# Patient Record
Sex: Male | Born: 1935 | Race: Black or African American | Hispanic: No | Marital: Married | Smoking: Former smoker
Health system: Southern US, Community
[De-identification: ages and names within clinical notes are randomized; demographics above are authoritative.]

## PROBLEM LIST (undated history)

## (undated) DIAGNOSIS — R238 Other skin changes: Secondary | ICD-10-CM

## (undated) DIAGNOSIS — R5383 Other fatigue: Secondary | ICD-10-CM

## (undated) DIAGNOSIS — R233 Spontaneous ecchymoses: Secondary | ICD-10-CM

## (undated) DIAGNOSIS — M7989 Other specified soft tissue disorders: Secondary | ICD-10-CM

## (undated) DIAGNOSIS — R413 Other amnesia: Secondary | ICD-10-CM

## (undated) DIAGNOSIS — I10 Essential (primary) hypertension: Secondary | ICD-10-CM

## (undated) DIAGNOSIS — R252 Cramp and spasm: Secondary | ICD-10-CM

## (undated) DIAGNOSIS — Z87448 Personal history of other diseases of urinary system: Secondary | ICD-10-CM

## (undated) HISTORY — DX: Spontaneous ecchymoses: R23.3

## (undated) HISTORY — DX: Essential (primary) hypertension: I10

## (undated) HISTORY — DX: Other specified soft tissue disorders: M79.89

## (undated) HISTORY — DX: Cramp and spasm: R25.2

## (undated) HISTORY — DX: Other fatigue: R53.83

## (undated) HISTORY — DX: Personal history of other diseases of urinary system: Z87.448

## (undated) HISTORY — DX: Other skin changes: R23.8

## (undated) HISTORY — DX: Other amnesia: R41.3

---

## 2010-10-13 DIAGNOSIS — R5383 Other fatigue: Secondary | ICD-10-CM

## 2010-10-13 DIAGNOSIS — E119 Type 2 diabetes mellitus without complications: Secondary | ICD-10-CM | POA: Insufficient documentation

## 2010-10-13 DIAGNOSIS — R079 Chest pain, unspecified: Secondary | ICD-10-CM | POA: Insufficient documentation

## 2010-10-13 DIAGNOSIS — M791 Myalgia, unspecified site: Secondary | ICD-10-CM | POA: Insufficient documentation

## 2010-10-13 DIAGNOSIS — I10 Essential (primary) hypertension: Secondary | ICD-10-CM | POA: Insufficient documentation

## 2010-10-13 DIAGNOSIS — R413 Other amnesia: Secondary | ICD-10-CM | POA: Insufficient documentation

## 2010-10-13 DIAGNOSIS — M7989 Other specified soft tissue disorders: Secondary | ICD-10-CM | POA: Insufficient documentation

## 2010-10-13 DIAGNOSIS — R238 Other skin changes: Secondary | ICD-10-CM | POA: Insufficient documentation

## 2010-10-13 DIAGNOSIS — Z87448 Personal history of other diseases of urinary system: Secondary | ICD-10-CM | POA: Insufficient documentation

## 2011-05-01 DIAGNOSIS — D352 Benign neoplasm of pituitary gland: Secondary | ICD-10-CM | POA: Insufficient documentation

## 2011-05-18 DIAGNOSIS — G4730 Sleep apnea, unspecified: Secondary | ICD-10-CM | POA: Insufficient documentation

## 2011-05-18 DIAGNOSIS — I10 Essential (primary) hypertension: Secondary | ICD-10-CM | POA: Insufficient documentation

## 2011-05-18 DIAGNOSIS — H269 Unspecified cataract: Secondary | ICD-10-CM | POA: Insufficient documentation

## 2011-05-18 DIAGNOSIS — E119 Type 2 diabetes mellitus without complications: Secondary | ICD-10-CM | POA: Insufficient documentation

## 2011-12-19 DIAGNOSIS — E1169 Type 2 diabetes mellitus with other specified complication: Secondary | ICD-10-CM | POA: Insufficient documentation

## 2011-12-19 DIAGNOSIS — E782 Mixed hyperlipidemia: Secondary | ICD-10-CM | POA: Insufficient documentation

## 2011-12-19 DIAGNOSIS — E785 Hyperlipidemia, unspecified: Secondary | ICD-10-CM | POA: Insufficient documentation

## 2012-09-30 DIAGNOSIS — K5730 Diverticulosis of large intestine without perforation or abscess without bleeding: Secondary | ICD-10-CM | POA: Insufficient documentation

## 2012-09-30 DIAGNOSIS — E876 Hypokalemia: Secondary | ICD-10-CM | POA: Insufficient documentation

## 2012-09-30 DIAGNOSIS — I519 Heart disease, unspecified: Secondary | ICD-10-CM | POA: Insufficient documentation

## 2012-09-30 DIAGNOSIS — M25569 Pain in unspecified knee: Secondary | ICD-10-CM | POA: Insufficient documentation

## 2012-09-30 DIAGNOSIS — I130 Hypertensive heart and chronic kidney disease with heart failure and stage 1 through stage 4 chronic kidney disease, or unspecified chronic kidney disease: Secondary | ICD-10-CM | POA: Insufficient documentation

## 2012-09-30 DIAGNOSIS — K649 Unspecified hemorrhoids: Secondary | ICD-10-CM | POA: Insufficient documentation

## 2012-09-30 DIAGNOSIS — M47817 Spondylosis without myelopathy or radiculopathy, lumbosacral region: Secondary | ICD-10-CM | POA: Insufficient documentation

## 2012-09-30 DIAGNOSIS — M545 Low back pain, unspecified: Secondary | ICD-10-CM | POA: Insufficient documentation

## 2012-09-30 DIAGNOSIS — Z7982 Long term (current) use of aspirin: Secondary | ICD-10-CM | POA: Insufficient documentation

## 2012-09-30 DIAGNOSIS — I517 Cardiomegaly: Secondary | ICD-10-CM | POA: Insufficient documentation

## 2012-09-30 DIAGNOSIS — K449 Diaphragmatic hernia without obstruction or gangrene: Secondary | ICD-10-CM | POA: Insufficient documentation

## 2012-09-30 DIAGNOSIS — I10 Essential (primary) hypertension: Secondary | ICD-10-CM | POA: Insufficient documentation

## 2012-09-30 DIAGNOSIS — K219 Gastro-esophageal reflux disease without esophagitis: Secondary | ICD-10-CM | POA: Insufficient documentation

## 2012-09-30 DIAGNOSIS — E785 Hyperlipidemia, unspecified: Secondary | ICD-10-CM | POA: Insufficient documentation

## 2012-09-30 DIAGNOSIS — E119 Type 2 diabetes mellitus without complications: Secondary | ICD-10-CM | POA: Insufficient documentation

## 2012-09-30 DIAGNOSIS — J309 Allergic rhinitis, unspecified: Secondary | ICD-10-CM | POA: Insufficient documentation

## 2012-09-30 DIAGNOSIS — J449 Chronic obstructive pulmonary disease, unspecified: Secondary | ICD-10-CM | POA: Insufficient documentation

## 2013-01-30 ENCOUNTER — Ambulatory Visit: Payer: Self-pay

## 2013-05-29 ENCOUNTER — Ambulatory Visit (INDEPENDENT_AMBULATORY_CARE_PROVIDER_SITE_OTHER): Payer: Medicare Other

## 2013-05-29 VITALS — BP 133/66 | HR 57 | Resp 16

## 2013-05-29 DIAGNOSIS — E1149 Type 2 diabetes mellitus with other diabetic neurological complication: Secondary | ICD-10-CM

## 2013-05-29 DIAGNOSIS — L608 Other nail disorders: Secondary | ICD-10-CM

## 2013-05-29 DIAGNOSIS — E1142 Type 2 diabetes mellitus with diabetic polyneuropathy: Secondary | ICD-10-CM

## 2013-05-29 DIAGNOSIS — E1140 Type 2 diabetes mellitus with diabetic neuropathy, unspecified: Secondary | ICD-10-CM

## 2013-05-29 DIAGNOSIS — Q828 Other specified congenital malformations of skin: Secondary | ICD-10-CM

## 2013-05-29 NOTE — Progress Notes (Signed)
   Subjective:    Patient ID: Richard Weeks, male    DOB: [birth year], 80 y.o.   MRN: [id number]  HPI I need my toenails trimmed up and I need my calluses trimmed up also    Review of Systems no new changes or findings     Objective:   Physical Exam Vascular status is intact although diminished DP pulses +2 for PT one over 4 bilateral Refill time 3 seconds all digits. Epicritic and progressive sensations diminished on Semmes Weinstein testing to the digits and plantar forefoot and arch. There digital contractures associated keratoses. Nails thick it'll friable discolored and darkened and incurvated tender both on palpation and with enclosed shoe wear. Neurologically epicritic and proprioceptive sensations diminished as mentioned there no open wounds or ulcerations at this time.       Assessment & Plan:  Assessment diabetes with history peripheral neuropathy. Multiple dystrophic probably mycotic nails with onychomycosis and dystrophy debrided and the presence of diabetes and complications. Also debridement single keratoses inferior right heel area , return for future palliative care in an as-needed basis  Harriet Masson DPM

## 2013-05-29 NOTE — Patient Instructions (Signed)
Diabetes and Foot Care Diabetes may cause you to have problems because of poor blood supply (circulation) to your feet and legs. This may cause the skin on your feet to become thinner, break easier, and heal more slowly. Your skin may become dry, and the skin may peel and crack. You may also have nerve damage in your legs and feet causing decreased feeling in them. You may not notice minor injuries to your feet that could lead to infections or more serious problems. Taking care of your feet is one of the most important things you can do for yourself.  HOME CARE INSTRUCTIONS  Wear shoes at all times, even in the house. Do not go barefoot. Bare feet are easily injured.  Check your feet daily for blisters, cuts, and redness. If you cannot see the bottom of your feet, use a mirror or ask someone for help.  Wash your feet with warm water (do not use hot water) and mild soap. Then pat your feet and the areas between your toes until they are completely dry. Do not soak your feet as this can dry your skin.  Apply a moisturizing lotion or petroleum jelly (that does not contain alcohol and is unscented) to the skin on your feet and to dry, brittle toenails. Do not apply lotion between your toes.  Trim your toenails straight across. Do not dig under them or around the cuticle. File the edges of your nails with an emery board or nail file.  Do not cut corns or calluses or try to remove them with medicine.  Wear clean socks or stockings every day. Make sure they are not too tight. Do not wear knee-high stockings since they may decrease blood flow to your legs.  Wear shoes that fit properly and have enough cushioning. To break in new shoes, wear them for just a few hours a day. This prevents you from injuring your feet. Always look in your shoes before you put them on to be sure there are no objects inside.  Do not cross your legs. This may decrease the blood flow to your feet.  If you find a minor scrape,  cut, or break in the skin on your feet, keep it and the skin around it clean and dry. These areas may be cleansed with mild soap and water. Do not cleanse the area with peroxide, alcohol, or iodine.  When you remove an adhesive bandage, be sure not to damage the skin around it.  If you have a wound, look at it several times a day to make sure it is healing.  Do not use heating pads or hot water bottles. They may burn your skin. If you have lost feeling in your feet or legs, you may not know it is happening until it is too late.  Make sure your health care provider performs a complete foot exam at least annually or more often if you have foot problems. Report any cuts, sores, or bruises to your health care provider immediately. SEEK MEDICAL CARE IF:   You have an injury that is not healing.  You have cuts or breaks in the skin.  You have an ingrown nail.  You notice redness on your legs or feet.  You feel burning or tingling in your legs or feet.  You have pain or cramps in your legs and feet.  Your legs or feet are numb.  Your feet always feel cold. SEEK IMMEDIATE MEDICAL CARE IF:   There is increasing redness,   swelling, or pain in or around a wound.  There is a red line that goes up your leg.  Pus is coming from a wound.  You develop a fever or as directed by your health care provider.  You notice a bad smell coming from an ulcer or wound. Document Released: 04/07/2000 Document Revised: 12/11/2012 Document Reviewed: 09/17/2012 [employer] Patient Information 2014 [employer].  

## 2013-08-28 ENCOUNTER — Ambulatory Visit: Payer: Medicare Other

## 2013-09-18 ENCOUNTER — Ambulatory Visit (INDEPENDENT_AMBULATORY_CARE_PROVIDER_SITE_OTHER): Payer: Medicare Other

## 2013-09-18 VITALS — BP 148/73 | HR 56 | Resp 18

## 2013-09-18 DIAGNOSIS — L608 Other nail disorders: Secondary | ICD-10-CM

## 2013-09-18 DIAGNOSIS — E1140 Type 2 diabetes mellitus with diabetic neuropathy, unspecified: Secondary | ICD-10-CM

## 2013-09-18 DIAGNOSIS — E1149 Type 2 diabetes mellitus with other diabetic neurological complication: Secondary | ICD-10-CM

## 2013-09-18 DIAGNOSIS — Q828 Other specified congenital malformations of skin: Secondary | ICD-10-CM

## 2013-09-18 NOTE — Progress Notes (Signed)
   Subjective:    Patient ID: Richard Weeks, male    DOB: 02/01/1933, 80 y.o.   MRN: [id number]  HPI I am here to get my toenails trimmed up    Review of Systems no new findings or systemic changes noted    Objective:   Physical Exam Masker status is intact pedal pulses palpable DP and PT plus one over 4 bilateral capillary refill time 3 seconds all digits epicritic and proprioceptive sensations diminished on Semmes Weinstein testing to forefoot digits and plantar foot and arch bilateral. There is normal plantar response DTRs not elicited dermatologically skin color pigment normal hair growth absent nails thick brittle crumbly friable discolored and darkened consistent with onychomycosis and dystrophy of nails. There is also keratoses distal clavus third left and pinch callus of the hallux bilateral although not as severe as they have in the past the benefit from use of diabetic shoes and palliative care and regular debridement. 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 Also this time debridement of dystrophic friable gratified nails 1 through 5 bilateral painful tender symptomatic nails debrided by Neosporin applied to the fifth toe left following debridement patient reappointed 3 months for continued palliative nail care we'll still try to  obtain authorization for diabetic accident shoes for his primary physician patient will take paperwork himself directly to get a copy that filled out. Reappointed 3 months for continued palliative care  Harriet Masson DPM

## 2013-09-18 NOTE — Patient Instructions (Signed)
Diabetes and Foot Care Diabetes may cause you to have problems because of poor blood supply (circulation) to your feet and legs. This may cause the skin on your feet to become thinner, break easier, and heal more slowly. Your skin may become dry, and the skin may peel and crack. You may also have nerve damage in your legs and feet causing decreased feeling in them. You may not notice minor injuries to your feet that could lead to infections or more serious problems. Taking care of your feet is one of the most important things you can do for yourself.  HOME CARE INSTRUCTIONS  Wear shoes at all times, even in the house. Do not go barefoot. Bare feet are easily injured.  Check your feet daily for blisters, cuts, and redness. If you cannot see the bottom of your feet, use a mirror or ask someone for help.  Wash your feet with warm water (do not use hot water) and mild soap. Then pat your feet and the areas between your toes until they are completely dry. Do not soak your feet as this can dry your skin.  Apply a moisturizing lotion or petroleum jelly (that does not contain alcohol and is unscented) to the skin on your feet and to dry, brittle toenails. Do not apply lotion between your toes.  Trim your toenails straight across. Do not dig under them or around the cuticle. File the edges of your nails with an emery board or nail file.  Do not cut corns or calluses or try to remove them with medicine.  Wear clean socks or stockings every day. Make sure they are not too tight. Do not wear knee-high stockings since they may decrease blood flow to your legs.  Wear shoes that fit properly and have enough cushioning. To break in new shoes, wear them for just a few hours a day. This prevents you from injuring your feet. Always look in your shoes before you put them on to be sure there are no objects inside.  Do not cross your legs. This may decrease the blood flow to your feet.  If you find a minor scrape,  cut, or break in the skin on your feet, keep it and the skin around it clean and dry. These areas may be cleansed with mild soap and water. Do not cleanse the area with peroxide, alcohol, or iodine.  When you remove an adhesive bandage, be sure not to damage the skin around it.  If you have a wound, look at it several times a day to make sure it is healing.  Do not use heating pads or hot water bottles. They may burn your skin. If you have lost feeling in your feet or legs, you may not know it is happening until it is too late.  Make sure your health care provider performs a complete foot exam at least annually or more often if you have foot problems. Report any cuts, sores, or bruises to your health care provider immediately. SEEK MEDICAL CARE IF:   You have an injury that is not healing.  You have cuts or breaks in the skin.  You have an ingrown nail.  You notice redness on your legs or feet.  You feel burning or tingling in your legs or feet.  You have pain or cramps in your legs and feet.  Your legs or feet are numb.  Your feet always feel cold. SEEK IMMEDIATE MEDICAL CARE IF:   There is increasing redness,   swelling, or pain in or around a wound.  There is a red line that goes up your leg.  Pus is coming from a wound.  You develop a fever or as directed by your health care provider.  You notice a bad smell coming from an ulcer or wound. Document Released: 04/07/2000 Document Revised: 12/11/2012 Document Reviewed: 09/17/2012 [employer] Patient Information 2014 [employer].  

## 2013-12-18 ENCOUNTER — Ambulatory Visit (INDEPENDENT_AMBULATORY_CARE_PROVIDER_SITE_OTHER): Payer: Medicare Other

## 2013-12-18 VITALS — BP 155/81 | HR 59 | Resp 18

## 2013-12-18 DIAGNOSIS — E1149 Type 2 diabetes mellitus with other diabetic neurological complication: Secondary | ICD-10-CM

## 2013-12-18 DIAGNOSIS — E1140 Type 2 diabetes mellitus with diabetic neuropathy, unspecified: Secondary | ICD-10-CM

## 2013-12-18 DIAGNOSIS — Q828 Other specified congenital malformations of skin: Secondary | ICD-10-CM

## 2013-12-18 DIAGNOSIS — L608 Other nail disorders: Secondary | ICD-10-CM

## 2013-12-18 NOTE — Patient Instructions (Signed)
Diabetes and Foot Care Diabetes may cause you to have problems because of poor blood supply (circulation) to your feet and legs. This may cause the skin on your feet to become thinner, break easier, and heal more slowly. Your skin may become dry, and the skin may peel and crack. You may also have nerve damage in your legs and feet causing decreased feeling in them. You may not notice minor injuries to your feet that could lead to infections or more serious problems. Taking care of your feet is one of the most important things you can do for yourself.  HOME CARE INSTRUCTIONS  Wear shoes at all times, even in the house. Do not go barefoot. Bare feet are easily injured.  Check your feet daily for blisters, cuts, and redness. If you cannot see the bottom of your feet, use a mirror or ask someone for help.  Wash your feet with warm water (do not use hot water) and mild soap. Then pat your feet and the areas between your toes until they are completely dry. Do not soak your feet as this can dry your skin.  Apply a moisturizing lotion or petroleum jelly (that does not contain alcohol and is unscented) to the skin on your feet and to dry, brittle toenails. Do not apply lotion between your toes.  Trim your toenails straight across. Do not dig under them or around the cuticle. File the edges of your nails with an emery board or nail file.  Do not cut corns or calluses or try to remove them with medicine.  Wear clean socks or stockings every day. Make sure they are not too tight. Do not wear knee-high stockings since they may decrease blood flow to your legs.  Wear shoes that fit properly and have enough cushioning. To break in new shoes, wear them for just a few hours a day. This prevents you from injuring your feet. Always look in your shoes before you put them on to be sure there are no objects inside.  Do not cross your legs. This may decrease the blood flow to your feet.  If you find a minor scrape,  cut, or break in the skin on your feet, keep it and the skin around it clean and dry. These areas may be cleansed with mild soap and water. Do not cleanse the area with peroxide, alcohol, or iodine.  When you remove an adhesive bandage, be sure not to damage the skin around it.  If you have a wound, look at it several times a day to make sure it is healing.  Do not use heating pads or hot water bottles. They may burn your skin. If you have lost feeling in your feet or legs, you may not know it is happening until it is too late.  Make sure your health care provider performs a complete foot exam at least annually or more often if you have foot problems. Report any cuts, sores, or bruises to your health care provider immediately. SEEK MEDICAL CARE IF:   You have an injury that is not healing.  You have cuts or breaks in the skin.  You have an ingrown nail.  You notice redness on your legs or feet.  You feel burning or tingling in your legs or feet.  You have pain or cramps in your legs and feet.  Your legs or feet are numb.  Your feet always feel cold. SEEK IMMEDIATE MEDICAL CARE IF:   There is increasing redness,   swelling, or pain in or around a wound.  There is a red line that goes up your leg.  Pus is coming from a wound.  You develop a fever or as directed by your health care provider.  You notice a bad smell coming from an ulcer or wound. Document Released: 04/07/2000 Document Revised: 12/11/2012 Document Reviewed: 09/17/2012 [employer] Patient Information 2015 [employer]. This information is not intended to replace advice given to you by your health care provider. Make sure you discuss any questions you have with your health care provider.  

## 2013-12-18 NOTE — Progress Notes (Signed)
   Subjective:    Patient ID: Richard Weeks, male    DOB: 1933-03-08, 80 y.o.   MRN: [id number]  HPI I AM HERE TO GET MY TOENAILS TRIMMED AND TO CHECK MY FEET    Review of Systems no new findings or systemic changes noted     Objective:   Physical Exam Pedal pulses palpable DP and PT plus one over 4 bilateral capillary refill timed 3-4 seconds. Epicritic sensations diminished on Semmes Weinstein to forefoot digits. 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

## 2014-03-16 ENCOUNTER — Ambulatory Visit (INDEPENDENT_AMBULATORY_CARE_PROVIDER_SITE_OTHER): Payer: Medicare Other

## 2014-03-16 DIAGNOSIS — L603 Nail dystrophy: Secondary | ICD-10-CM

## 2014-03-16 DIAGNOSIS — E1140 Type 2 diabetes mellitus with diabetic neuropathy, unspecified: Secondary | ICD-10-CM

## 2014-03-16 DIAGNOSIS — Q828 Other specified congenital malformations of skin: Secondary | ICD-10-CM

## 2014-03-16 NOTE — Progress Notes (Signed)
   Subjective:    Patient ID: Richard Weeks, male    DOB: Apr 02, 1933, 80 y.o.   MRN: [id number]  HPI  TOENAILS TRIM.  Review of Systems no new findings or systemic changes noted     Objective:   Physical Exam Lower extremity objective findings DP and PT plus one over 4 capillary refill time 3 seconds epicritic sensation decreased on Semmes Weinstein. Nails thick extremely brittle crumbly friable dystrophic 1 through 5 bilateral no open wounds no ulcers no secondary infections still waiting on diabetic shoes gravida been ordered but not available for fitting. Patient does have diabetes with neuropathy and angiopathy. 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

## 2014-03-16 NOTE — Patient Instructions (Signed)
Diabetes and Foot Care Diabetes may cause you to have problems because of poor blood supply (circulation) to your feet and legs. This may cause the skin on your feet to become thinner, break easier, and heal more slowly. Your skin may become dry, and the skin may peel and crack. You may also have nerve damage in your legs and feet causing decreased feeling in them. You may not notice minor injuries to your feet that could lead to infections or more serious problems. Taking care of your feet is one of the most important things you can do for yourself.  HOME CARE INSTRUCTIONS  Wear shoes at all times, even in the house. Do not go barefoot. Bare feet are easily injured.  Check your feet daily for blisters, cuts, and redness. If you cannot see the bottom of your feet, use a mirror or ask someone for help.  Wash your feet with warm water (do not use hot water) and mild soap. Then pat your feet and the areas between your toes until they are completely dry. Do not soak your feet as this can dry your skin.  Apply a moisturizing lotion or petroleum jelly (that does not contain alcohol and is unscented) to the skin on your feet and to dry, brittle toenails. Do not apply lotion between your toes.  Trim your toenails straight across. Do not dig under them or around the cuticle. File the edges of your nails with an emery board or nail file.  Do not cut corns or calluses or try to remove them with medicine.  Wear clean socks or stockings every day. Make sure they are not too tight. Do not wear knee-high stockings since they may decrease blood flow to your legs.  Wear shoes that fit properly and have enough cushioning. To break in new shoes, wear them for just a few hours a day. This prevents you from injuring your feet. Always look in your shoes before you put them on to be sure there are no objects inside.  Do not cross your legs. This may decrease the blood flow to your feet.  If you find a minor scrape,  cut, or break in the skin on your feet, keep it and the skin around it clean and dry. These areas may be cleansed with mild soap and water. Do not cleanse the area with peroxide, alcohol, or iodine.  When you remove an adhesive bandage, be sure not to damage the skin around it.  If you have a wound, look at it several times a day to make sure it is healing.  Do not use heating pads or hot water bottles. They may burn your skin. If you have lost feeling in your feet or legs, you may not know it is happening until it is too late.  Make sure your health care provider performs a complete foot exam at least annually or more often if you have foot problems. Report any cuts, sores, or bruises to your health care provider immediately. SEEK MEDICAL CARE IF:   You have an injury that is not healing.  You have cuts or breaks in the skin.  You have an ingrown nail.  You notice redness on your legs or feet.  You feel burning or tingling in your legs or feet.  You have pain or cramps in your legs and feet.  Your legs or feet are numb.  Your feet always feel cold. SEEK IMMEDIATE MEDICAL CARE IF:   There is increasing redness,   swelling, or pain in or around a wound.  There is a red line that goes up your leg.  Pus is coming from a wound.  You develop a fever or as directed by your health care provider.  You notice a bad smell coming from an ulcer or wound. Document Released: 04/07/2000 Document Revised: 12/11/2012 Document Reviewed: 09/17/2012 [employer] Patient Information 2015 [employer]. This information is not intended to replace advice given to you by your health care provider. Make sure you discuss any questions you have with your health care provider.  

## 2014-05-15 ENCOUNTER — Ambulatory Visit: Payer: Medicare Other

## 2014-06-01 ENCOUNTER — Ambulatory Visit (INDEPENDENT_AMBULATORY_CARE_PROVIDER_SITE_OTHER): Payer: Medicare Other

## 2014-06-01 VITALS — BP 157/78 | HR 64 | Resp 18

## 2014-06-01 DIAGNOSIS — M2040 Other hammer toe(s) (acquired), unspecified foot: Secondary | ICD-10-CM

## 2014-06-01 DIAGNOSIS — E1140 Type 2 diabetes mellitus with diabetic neuropathy, unspecified: Secondary | ICD-10-CM

## 2014-06-01 DIAGNOSIS — Q828 Other specified congenital malformations of skin: Secondary | ICD-10-CM

## 2014-06-01 DIAGNOSIS — L603 Nail dystrophy: Secondary | ICD-10-CM

## 2014-06-01 NOTE — Progress Notes (Signed)
   Subjective:    Patient ID: Richard Weeks, male    DOB: 01/31/1933, 81 y.o.   MRN: [id number]  HPI I AM HERE TO GET MY DIABETIC SHOES AND I NEED MY TOENAILS TRIMMED UP    Review of Systems no new findings or systemic changes noted     Objective:   Physical Exam Neurovascular status is intact and unchanged pedal pulses DP and PT plus one over 4 bilateral Refill time 3 seconds all digits epicritic and proprioceptive sensations grossly diminished on Semmes Weinstein to the forefoot digits arch bilateral. No open wounds no ulcers patient does have keratoses subsecond right pinch callus first bilateral and distal clavus third left. Multiple keratotic lesions are noted at this time and debrided as well as multiple thick brittle friable criptotic nails 1 through 5 bilateral painful mycotic dystrophic incurvated ingrowing nails are debrided and the presence of pain symptoms mild as well as diabetes and complications. Patient does have digital contractures dry scaling skin recommended lotion daily. Patient at this time is dispensed 1 pair of diabetic shoes and 3 pairs of custom molded dual density inlays at fit and contour well with full contact to the foot and arch bilateral. Oral and written instructions for shoewear and break in are given at this time. Nails are also debrided and calluses or calluses are debrided       Assessment & Plan:  Assessment diabetes with peripheral neuropathy and complications and angiopathy. Debridement of dystrophic friable mycotic nails 1 through 5 bilateral this time debridement multiple keratoses pinch callus bilateral hallux subsecond right and distal third left. Also this time dispense shoes with an dual density insoles with break in wearing instructions recheck in 3 months for future palliative care is needed no other new complications or changes noted at this time next  Harriet Masson DPM

## 2014-06-18 ENCOUNTER — Ambulatory Visit: Payer: Medicare Other

## 2014-09-03 ENCOUNTER — Ambulatory Visit: Payer: Medicare Other | Admitting: Podiatrist

## 2014-09-17 ENCOUNTER — Encounter: Payer: Self-pay | Admitting: Podiatrist

## 2014-09-17 ENCOUNTER — Ambulatory Visit (INDEPENDENT_AMBULATORY_CARE_PROVIDER_SITE_OTHER): Payer: Medicare Other | Admitting: Podiatrist

## 2014-09-17 VITALS — BP 125/65 | HR 61 | Resp 18

## 2014-09-17 DIAGNOSIS — L603 Nail dystrophy: Secondary | ICD-10-CM

## 2014-09-17 DIAGNOSIS — E1140 Type 2 diabetes mellitus with diabetic neuropathy, unspecified: Secondary | ICD-10-CM

## 2014-09-17 DIAGNOSIS — Q828 Other specified congenital malformations of skin: Secondary | ICD-10-CM | POA: Diagnosis not present

## 2014-09-22 NOTE — Progress Notes (Signed)
HPI: Patient presents today for follow up of diabetic foot and nail care. Past medical history, meds, and allergies reviewed. Patient states blood sugar is under good  control.   Objective:   Objective:  Patients chart is reviewed.  Vascular status reveals pedal pulses noted at  1 out of 4 dp and pt bilateral .  Neurological sensation is Decreased to [employer] monofilament bilateral at 2/5 sites bilateral.  Dermatological exam reveals  presence of pre ulcerative/ hyperkeratotic lesions noted bilateral feet.   Toenails are elongated, incurvated, discolored, dystrophic with ingrown deformity present.    Assessment: Diabetes with Neuropathy , Ingrown nail deformity, hyperkeratotic lesion bilateral feet.   Plan: Discussed treatment options and alternatives. Debrided nails without complication. Debrided hyperkeratotic lesions without complication.  Return appointment recommended at routine intervals of 3 months.

## 2014-11-03 DIAGNOSIS — G4733 Obstructive sleep apnea (adult) (pediatric): Secondary | ICD-10-CM | POA: Insufficient documentation

## 2014-12-21 ENCOUNTER — Ambulatory Visit (INDEPENDENT_AMBULATORY_CARE_PROVIDER_SITE_OTHER): Payer: Medicare Other | Admitting: Podiatry

## 2014-12-21 ENCOUNTER — Encounter: Payer: Self-pay | Admitting: Podiatry

## 2014-12-21 DIAGNOSIS — E1140 Type 2 diabetes mellitus with diabetic neuropathy, unspecified: Secondary | ICD-10-CM

## 2014-12-21 DIAGNOSIS — B351 Tinea unguium: Secondary | ICD-10-CM

## 2014-12-21 DIAGNOSIS — M79676 Pain in unspecified toe(s): Secondary | ICD-10-CM | POA: Diagnosis not present

## 2014-12-21 NOTE — Progress Notes (Signed)
Patient ID: Richard Weeks, male   DOB: [birth year], 81 y.o.   MRN: [id number] Complaint:  Visit Type: Patient returns to my office for continued preventative foot care services. Complaint: Patient states" my nails have grown long and thick and become painful to walk and wear shoes" Patient has been diagnosed with DM with neuropathy.. The patient presents for preventative foot care services. No changes to ROS  Podiatric Exam: Vascular: dorsalis pedis and posterior tibial pulses are palpable bilateral. Capillary return is immediate. Temperature gradient is WNL. Skin turgor WNL  Sensorium: Diminished Semmes Weinstein monofilament test. Normal tactile sensation bilaterally. Nail Exam: Pt has thick disfigured discolored nails with subungual debris noted bilateral entire nail hallux through fifth toenails Ulcer Exam: There is no evidence of ulcer or pre-ulcerative changes or infection. Orthopedic Exam: Muscle tone and strength are WNL. No limitations in general ROM. No crepitus or effusions noted. Foot type and digits show no abnormalities. Bony prominences are unremarkable. Skin: No Porokeratosis. No infection or ulcers  Diagnosis:  Onychomycosis, , Pain in right toe, pain in left toes  Treatment & Plan Procedures and Treatment: Consent by patient was obtained for treatment procedures. The patient understood the discussion of treatment and procedures well. All questions were answered thoroughly reviewed. Debridement of mycotic and hypertrophic toenails, 1 through 5 bilateral and clearing of subungual debris. No ulceration, no infection noted.  Return Visit-Office Procedure: Patient instructed to return to the office for a follow up visit 3 months for continued evaluation and treatment.

## 2015-03-03 ENCOUNTER — Ambulatory Visit: Payer: Medicare Other | Admitting: Podiatry

## 2015-03-03 ENCOUNTER — Ambulatory Visit: Payer: Medicare Other | Admitting: Sports Medicine

## 2015-03-04 ENCOUNTER — Encounter: Payer: Self-pay | Admitting: Sports Medicine

## 2015-03-04 ENCOUNTER — Ambulatory Visit (INDEPENDENT_AMBULATORY_CARE_PROVIDER_SITE_OTHER): Payer: Medicare Other | Admitting: Sports Medicine

## 2015-03-04 DIAGNOSIS — E1140 Type 2 diabetes mellitus with diabetic neuropathy, unspecified: Secondary | ICD-10-CM

## 2015-03-04 DIAGNOSIS — B351 Tinea unguium: Secondary | ICD-10-CM | POA: Diagnosis not present

## 2015-03-04 DIAGNOSIS — M79676 Pain in unspecified toe(s): Secondary | ICD-10-CM | POA: Diagnosis not present

## 2015-03-04 NOTE — Progress Notes (Signed)
Patient ID: Richard Weeks, male   DOB: 1932/10/11, 81 y.o.   MRN: [id number] Subjective: Richard Weeks is a 81 y.o. male patient with history of type 2 diabetes who presents to office today complaining of long, painful nails  while ambulating in shoes; unable to trim. Patient states that the glucose reading this morning was not recorded. Patient denies any new changes in medication or new problems. Patient denies any new cramping, numbness, burning or tingling in the legs.  Patient Active Problem List   Diagnosis Date Noted  . Diabetes mellitus 10/13/2010  . Hypertension 10/13/2010  . Muscle pain 10/13/2010  . Bilateral swelling of feet 10/13/2010  . Fatigue 10/13/2010  . Chest pain 10/13/2010  . Memory loss 10/13/2010  . Bruises easily 10/13/2010  . History of bladder problems 10/13/2010   Current Outpatient Prescriptions on File Prior to Visit  Medication Sig Dispense Refill  . albuterol (PROVENTIL HFA;VENTOLIN HFA) 108 (90 BASE) MCG/ACT inhaler Inhale into the lungs.    Marland Kitchen aspirin 325 MG tablet Take 325 mg by mouth.    . Aspirin-Caffeine 500-32.5 MG TABS Take by mouth.    Marland Kitchen atenolol (TENORMIN) 50 MG tablet Take 50 mg by mouth.    . ATENOLOL PO Take by mouth daily.      Marland Kitchen atorvastatin (LIPITOR) 10 MG tablet Take 10 mg by mouth.    . baclofen (LIORESAL) 10 MG tablet Take 10 mg by mouth 3 (three) times daily as needed. for muscle spams  0  . baclofen (LIORESAL) 10 MG tablet Take 10 mg by mouth.    . budesonide (PULMICORT) 0.25 MG/2ML nebulizer solution 0.25 mg.    . budesonide (RHINOCORT AQUA) 32 MCG/ACT nasal spray Frequency:PHARMDIR   Dosage:0.0     Instructions:  Note:2 Sprays / nostril QHS. Dose: 2 SPRAYS    . budesonide (RHINOCORT AQUA) 32 MCG/ACT nasal spray Spray 2 squirts each nostril at bedtime    . budesonide (RHINOCORT AQUA) 32 MCG/ACT nasal spray Spray 2 squirts each nostril at bedtime    . capsaicin (ZOSTRIX) 0.025 % cream Frequency:PHARMDIR   Dosage:0.0     Instructions:   Note:Apply to knees up toTID as needed for pain.`E1o3L` Wash hands after use. Dose: 0.025 %    . CLONIDINE HCL PO Take by mouth 2 (two) times daily.      Marland Kitchen diltiazem (CARDIZEM SR) 120 MG 12 hr capsule Take 120 mg by mouth 2 (two) times daily.  1  . diltiazem (DILACOR XR) 120 MG 24 hr capsule Take 120 mg by mouth.    Marland Kitchen DILTIAZEM HCL PO Take by mouth 2 (two) times daily.      . Dutasteride (AVODART PO) Take by mouth.      . dutasteride (AVODART) 0.5 MG capsule Take 0.5 mg by mouth daily.      . finasteride (PROSCAR) 5 MG tablet Take 5 mg by mouth.    . finasteride (PROSCAR) 5 MG tablet Take 5 mg by mouth daily.  25  . Fluticasone-Salmeterol (ADVAIR DISKUS) 250-50 MCG/DOSE AEPB Inhale into the lungs.    . Fluticasone-Salmeterol (ADVAIR HFA IN) Inhale into the lungs.      Marland Kitchen glimepiride (AMARYL) 1 MG tablet Take 1 mg by mouth.    Marland Kitchen GLIMEPIRIDE PO Take by mouth daily.      . hydrALAZINE (APRESOLINE) 50 MG tablet Take 50 mg by mouth.    . hydrALAZINE (APRESOLINE) 50 MG tablet Increased to 3 pills a day    . HYDRALAZINE HCL PO  Take 50 mg by mouth 2 (two) times daily.      Marland Kitchen HYDROcodone-acetaminophen (VICODIN) 5-500 MG per tablet Take by mouth.    Marland Kitchen HYDROcodone-homatropine (HYCODAN) 5-1.5 MG/5ML syrup   0  . IPRATROPIUM BROMIDE NA Place into the nose every 4 (four) hours.      . Ipratropium-Albuterol (ALBUTEROL-IPRATROPIUM IN) Inhale into the lungs every 4 (four) hours.      Marland Kitchen labetalol (NORMODYNE) 200 MG tablet Take 200 mg by mouth.    Marland Kitchen lisinopril (PRINIVIL,ZESTRIL) 10 MG tablet Take 10 mg by mouth daily.  2  . lisinopril (PRINIVIL,ZESTRIL) 10 MG tablet Take 10 mg by mouth.    . loratadine (CLARITIN) 10 MG tablet Take 10 mg by mouth.    . loratadine (CLARITIN) 10 MG tablet Take 10 mg by mouth.    Marland Kitchen LORazepam (ATIVAN) 0.5 MG tablet   2  . meclizine (ANTIVERT) 12.5 MG tablet Take 12.5 mg by mouth.    . meloxicam (MOBIC) 7.5 MG tablet Take 7.5 mg by mouth daily. with food  3  . METFORMIN HCL PO  Take by mouth daily.      . mometasone (ASMANEX 120 METERED DOSES) 220 MCG/INH inhaler Frequency:PHARMDIR   Dosage:0.0     Instructions:  Note:Inhale 1 inhalation twice daily. Rinse mouth after using`E1o3L` 3 month supply Dose: ONE    . mometasone (ASMANEX) 220 MCG/INH inhaler Inhale 2 puffs into the lungs daily.      . mometasone (ASMANEX) 220 MCG/INH inhaler Inhale into the lungs.    . mometasone-formoterol (DULERA) 100-5 MCG/ACT AERO Inhale into the lungs.    . montelukast (SINGULAIR) 10 MG tablet Take 10 mg by mouth.    . Montelukast Sodium (SINGULAIR PO) Take by mouth daily.      . nitroGLYCERIN (NITROSTAT) 0.4 MG SL tablet Place 0.4 mg under the tongue.    . Omega-3 Fatty Acids (OMEGA 3 PO) Take 2 capsules by mouth daily.      Marland Kitchen omeprazole (PRILOSEC) 20 MG capsule TAKE 1 CAPSULE DAILY    . Potassium Chlorate GRAN by Does not apply route 2 (two) times daily.      . potassium chloride SA (K-DUR,KLOR-CON) 20 MEQ tablet Take by mouth.    . potassium chloride SA (KLOR-CON M20) 20 MEQ tablet Take by mouth.    . Ranitidine HCl 150 MG PACK Take 150 mg by mouth 2 (two) times daily.      . solifenacin (VESICARE) 5 MG tablet Take 10 mg by mouth.    . solifenacin (VESICARE) 5 MG tablet Take 5 mg by mouth.    . tamsulosin (FLOMAX) 0.4 MG CAPS capsule Take 0.4 mg by mouth.    . TAMSULOSIN HCL PO Take by mouth daily.      Marland Kitchen terazosin (HYTRIN) 2 MG capsule Take 2 mg by mouth.    . TERAZOSIN HCL PO Take 2 mg by mouth daily.      Marland Kitchen tiotropium (SPIRIVA HANDIHALER) 18 MCG inhalation capsule Place into inhaler and inhale.    . tiotropium (SPIRIVA) 18 MCG inhalation capsule Place into inhaler and inhale.    . tiotropium (SPIRIVA) 18 MCG inhalation capsule Place into inhaler and inhale.    . TRIAMTERENE PO Take by mouth daily.      Marland Kitchen triamterene-hydrochlorothiazide (MAXZIDE-25) 37.5-25 MG per tablet Take by mouth.     No current facility-administered medications on file prior to visit.   No Known  Allergies  Labs: HEMOGLOBIN A1C- No recent lab on file  Objective: General: Patient is awake, alert, and oriented x 3 and in no acute distress.  Integument: Skin is warm, dry and supple bilateral. Nails are tender, long, thickened and  dystrophic with subungual debris, consistent with onychomycosis, 1-5 bilateral. No signs of infection. No open lesions or preulcerative lesions present bilateral. Remaining integument unremarkable.  Vasculature:  Dorsalis Pedis pulse 1/4 bilateral. Posterior Tibial pulse  1/4 bilateral.  Capillary fill time <3 sec 1-5 bilateral. Scant hair growth to the level of the digits. Temperature gradient within normal limits. No varicosities present bilateral. No edema present bilateral.   Neurology: The patient has diminished sensation measured with a 5.07/10g Semmes Weinstein Monofilament at all pedal sites bilateral . Vibratory sensation diminished bilateral with tuning fork. No Babinski sign present bilateral.   Musculoskeletal: Asymptomatic hammertoes deformities noted bilateral. Muscular strength 5/5 in all lower extremity muscular groups bilateral without pain or limitation on range of motion . No tenderness with calf compression bilateral.  Assessment and Plan: Problem List Items Addressed This Visit    None    Visit Diagnoses    Pain due to onychomycosis of toenail    -  Primary    Type 2 diabetes, controlled, with neuropathy ([place])          -Examined patient. -Discussed and educated patient on diabetic foot care, especially with  regards to the vascular, neurological and musculoskeletal systems.  -Stressed the importance of good glycemic control and the detriment of not  controlling glucose levels in relation to the foot. -Mechanically debrided all nails 1-5 bilateral using sterile nail nipper and filed with dremel without incident  -Cont with good supportive shoes daily.  -Answered all patient questions -Patient to return as needed or in 3 months  for at risk foot care -Patient advised to call the office if any problems or questions arise in the  Meantime.  Landis Martins, DPM

## 2015-06-03 ENCOUNTER — Encounter: Payer: Self-pay | Admitting: Sports Medicine

## 2015-06-03 ENCOUNTER — Ambulatory Visit (INDEPENDENT_AMBULATORY_CARE_PROVIDER_SITE_OTHER): Payer: Medicare Other | Admitting: Sports Medicine

## 2015-06-03 DIAGNOSIS — B351 Tinea unguium: Secondary | ICD-10-CM

## 2015-06-03 DIAGNOSIS — E1140 Type 2 diabetes mellitus with diabetic neuropathy, unspecified: Secondary | ICD-10-CM

## 2015-06-03 DIAGNOSIS — M79676 Pain in unspecified toe(s): Secondary | ICD-10-CM | POA: Diagnosis not present

## 2015-06-03 NOTE — Progress Notes (Signed)
Patient ID: Dredan Timbers, male   DOB: Jun 29, 1932, 82 y.o.   MRN: [id number] Subjective: Richard Weeks is a 82 y.o. male patient with history of type 2 diabetes who presents to office today complaining of long, painful nails  while ambulating in shoes; unable to trim. Patient states that the glucose reading this morning was 110mg /dl. Patient denies any new changes in medication or new problems. Patient denies any new cramping, numbness, burning or tingling in the legs.  Patient is to have TKR on right on Tuesday in [place].   Patient Active Problem List   Diagnosis Date Noted  . Diabetes mellitus 10/13/2010  . Hypertension 10/13/2010  . Muscle pain 10/13/2010  . Bilateral swelling of feet 10/13/2010  . Fatigue 10/13/2010  . Chest pain 10/13/2010  . Memory loss 10/13/2010  . Bruises easily 10/13/2010  . History of bladder problems 10/13/2010   Current Outpatient Prescriptions on File Prior to Visit  Medication Sig Dispense Refill  . albuterol (PROVENTIL HFA;VENTOLIN HFA) 108 (90 BASE) MCG/ACT inhaler Inhale into the lungs.    Marland Kitchen aspirin 325 MG tablet Take 325 mg by mouth.    . Aspirin-Caffeine 500-32.5 MG TABS Take by mouth.    Marland Kitchen atenolol (TENORMIN) 50 MG tablet Take 50 mg by mouth.    . ATENOLOL PO Take by mouth daily.      Marland Kitchen atorvastatin (LIPITOR) 10 MG tablet Take 10 mg by mouth.    . baclofen (LIORESAL) 10 MG tablet Take 10 mg by mouth 3 (three) times daily as needed. for muscle spams  0  . baclofen (LIORESAL) 10 MG tablet Take 10 mg by mouth.    . budesonide (PULMICORT) 0.25 MG/2ML nebulizer solution 0.25 mg.    . budesonide (RHINOCORT AQUA) 32 MCG/ACT nasal spray Frequency:PHARMDIR   Dosage:0.0     Instructions:  Note:2 Sprays / nostril QHS. Dose: 2 SPRAYS    . budesonide (RHINOCORT AQUA) 32 MCG/ACT nasal spray Spray 2 squirts each nostril at bedtime    . budesonide (RHINOCORT AQUA) 32 MCG/ACT nasal spray Spray 2 squirts each nostril at bedtime    . capsaicin (ZOSTRIX) 0.025 % cream  Frequency:PHARMDIR   Dosage:0.0     Instructions:  Note:Apply to knees up toTID as needed for pain.`E1o3L` Wash hands after use. Dose: 0.025 %    . CLONIDINE HCL PO Take by mouth 2 (two) times daily.      Marland Kitchen diltiazem (CARDIZEM SR) 120 MG 12 hr capsule Take 120 mg by mouth 2 (two) times daily.  1  . diltiazem (DILACOR XR) 120 MG 24 hr capsule Take 120 mg by mouth.    Marland Kitchen DILTIAZEM HCL PO Take by mouth 2 (two) times daily.      . Dutasteride (AVODART PO) Take by mouth.      . dutasteride (AVODART) 0.5 MG capsule Take 0.5 mg by mouth daily.      . finasteride (PROSCAR) 5 MG tablet Take 5 mg by mouth.    . finasteride (PROSCAR) 5 MG tablet Take 5 mg by mouth daily.  25  . Fluticasone-Salmeterol (ADVAIR DISKUS) 250-50 MCG/DOSE AEPB Inhale into the lungs.    . Fluticasone-Salmeterol (ADVAIR HFA IN) Inhale into the lungs.      Marland Kitchen glimepiride (AMARYL) 1 MG tablet Take 1 mg by mouth.    Marland Kitchen GLIMEPIRIDE PO Take by mouth daily.      . hydrALAZINE (APRESOLINE) 50 MG tablet Take 50 mg by mouth.    . hydrALAZINE (APRESOLINE) 50 MG tablet Increased  to 3 pills a day    . HYDRALAZINE HCL PO Take 50 mg by mouth 2 (two) times daily.      Marland Kitchen HYDROcodone-acetaminophen (VICODIN) 5-500 MG per tablet Take by mouth.    Marland Kitchen HYDROcodone-homatropine (HYCODAN) 5-1.5 MG/5ML syrup   0  . IPRATROPIUM BROMIDE NA Place into the nose every 4 (four) hours.      . Ipratropium-Albuterol (ALBUTEROL-IPRATROPIUM IN) Inhale into the lungs every 4 (four) hours.      Marland Kitchen labetalol (NORMODYNE) 200 MG tablet Take 200 mg by mouth.    Marland Kitchen lisinopril (PRINIVIL,ZESTRIL) 10 MG tablet Take 10 mg by mouth daily.  2  . lisinopril (PRINIVIL,ZESTRIL) 10 MG tablet Take 10 mg by mouth.    . loratadine (CLARITIN) 10 MG tablet Take 10 mg by mouth.    . loratadine (CLARITIN) 10 MG tablet Take 10 mg by mouth.    Marland Kitchen LORazepam (ATIVAN) 0.5 MG tablet   2  . meclizine (ANTIVERT) 12.5 MG tablet Take 12.5 mg by mouth.    . meloxicam (MOBIC) 7.5 MG tablet Take 7.5 mg by  mouth daily. with food  3  . METFORMIN HCL PO Take by mouth daily.      . mometasone (ASMANEX 120 METERED DOSES) 220 MCG/INH inhaler Frequency:PHARMDIR   Dosage:0.0     Instructions:  Note:Inhale 1 inhalation twice daily. Rinse mouth after using`E1o3L` 3 month supply Dose: ONE    . mometasone (ASMANEX) 220 MCG/INH inhaler Inhale 2 puffs into the lungs daily.      . mometasone (ASMANEX) 220 MCG/INH inhaler Inhale into the lungs.    . mometasone-formoterol (DULERA) 100-5 MCG/ACT AERO Inhale into the lungs.    . montelukast (SINGULAIR) 10 MG tablet Take 10 mg by mouth.    . Montelukast Sodium (SINGULAIR PO) Take by mouth daily.      . nitroGLYCERIN (NITROSTAT) 0.4 MG SL tablet Place 0.4 mg under the tongue.    . Omega-3 Fatty Acids (OMEGA 3 PO) Take 2 capsules by mouth daily.      Marland Kitchen omeprazole (PRILOSEC) 20 MG capsule TAKE 1 CAPSULE DAILY    . Potassium Chlorate GRAN by Does not apply route 2 (two) times daily.      . potassium chloride SA (K-DUR,KLOR-CON) 20 MEQ tablet Take by mouth.    . potassium chloride SA (KLOR-CON M20) 20 MEQ tablet Take by mouth.    . Ranitidine HCl 150 MG PACK Take 150 mg by mouth 2 (two) times daily.      . solifenacin (VESICARE) 5 MG tablet Take 10 mg by mouth.    . solifenacin (VESICARE) 5 MG tablet Take 5 mg by mouth.    . tamsulosin (FLOMAX) 0.4 MG CAPS capsule Take 0.4 mg by mouth.    . TAMSULOSIN HCL PO Take by mouth daily.      Marland Kitchen terazosin (HYTRIN) 2 MG capsule Take 2 mg by mouth.    . TERAZOSIN HCL PO Take 2 mg by mouth daily.      Marland Kitchen tiotropium (SPIRIVA HANDIHALER) 18 MCG inhalation capsule Place into inhaler and inhale.    . tiotropium (SPIRIVA) 18 MCG inhalation capsule Place into inhaler and inhale.    . tiotropium (SPIRIVA) 18 MCG inhalation capsule Place into inhaler and inhale.    . TRIAMTERENE PO Take by mouth daily.      Marland Kitchen triamterene-hydrochlorothiazide (MAXZIDE-25) 37.5-25 MG per tablet Take by mouth.     No current facility-administered  medications on file prior to visit.  No Known Allergies  Labs: HEMOGLOBIN A1C- No recent lab on file   Objective: General: Patient is awake, alert, and oriented x 3 and in no acute distress.  Integument: Skin is warm, dry and supple bilateral. Nails are tender, long, thickened and  dystrophic with subungual debris, consistent with onychomycosis, 1-5 bilateral. No signs of infection. No open lesions or preulcerative lesions present bilateral. Remaining integument unremarkable.  Vasculature:  Dorsalis Pedis pulse 1/4 bilateral. Posterior Tibial pulse  1/4 bilateral.  Capillary fill time <3 sec 1-5 bilateral. Scant hair growth to the level of the digits. Temperature gradient within normal limits. No varicosities present bilateral. No edema present bilateral.   Neurology: The patient has diminished sensation measured with a 5.07/10g Semmes Weinstein Monofilament at all pedal sites bilateral . Vibratory sensation diminished bilateral with tuning fork. No Babinski sign present bilateral.   Musculoskeletal: Asymptomatic hammertoes deformities noted bilateral. Muscular strength 5/5 in all lower extremity muscular groups bilateral without pain or limitation on range of motion . No tenderness with calf compression bilateral.  Assessment and Plan: Problem List Items Addressed This Visit    None    Visit Diagnoses    Pain due to onychomycosis of toenail    -  Primary    Type 2 diabetes, controlled, with neuropathy ([place])          -Examined patient. -Discussed and educated patient on diabetic foot care, especially with  regards to the vascular, neurological and musculoskeletal systems.  -Stressed the importance of good glycemic control and the detriment of not  controlling glucose levels in relation to the foot. -Mechanically debrided all nails 1-5 bilateral using sterile nail nipper and filed with dremel without incident  -Recommend daily skin emollients.  -Cont with good supportive shoes  daily.  -Answered all patient questions -Patient to return as needed or in 3 months for at risk foot care -Patient advised to call the office if any problems or questions arise in the  Meantime.  Landis Martins, DPM

## 2015-06-09 ENCOUNTER — Ambulatory Visit: Payer: Medicare Other | Admitting: Sports Medicine

## 2015-09-01 ENCOUNTER — Ambulatory Visit (INDEPENDENT_AMBULATORY_CARE_PROVIDER_SITE_OTHER): Payer: Medicare Other | Admitting: Sports Medicine

## 2015-09-01 ENCOUNTER — Encounter: Payer: Self-pay | Admitting: Sports Medicine

## 2015-09-01 DIAGNOSIS — B351 Tinea unguium: Secondary | ICD-10-CM | POA: Diagnosis not present

## 2015-09-01 DIAGNOSIS — M79676 Pain in unspecified toe(s): Secondary | ICD-10-CM

## 2015-09-01 DIAGNOSIS — Q828 Other specified congenital malformations of skin: Secondary | ICD-10-CM | POA: Diagnosis not present

## 2015-09-01 DIAGNOSIS — E1140 Type 2 diabetes mellitus with diabetic neuropathy, unspecified: Secondary | ICD-10-CM | POA: Diagnosis not present

## 2015-09-01 DIAGNOSIS — M2040 Other hammer toe(s) (acquired), unspecified foot: Secondary | ICD-10-CM

## 2015-09-01 NOTE — Progress Notes (Signed)
Patient ID: Richard Weeks, male   DOB: 01-Dec-1932, 82 y.o.   MRN: [id number]   Subjective: Richard Weeks is a 82 y.o. male patient with history of type 2 diabetes who presents to office today complaining of long, painful nails  while ambulating in shoes and callus; unable to trim. Patient states that the glucose reading this morning was 97mg /dl. Patient denies any new changes in medication or new problems. Patient denies any new cramping, numbness, burning or tingling in the legs.  Patient had Right TKR in [place] and has finished therapy with no problems.   Patient Active Problem List   Diagnosis Date Noted  . Obstructive apnea 11/03/2014  . Allergic rhinitis 09/30/2012  . Cardiac enlargement 09/30/2012  . Chronic obstructive pulmonary disease ([place]) 09/30/2012  . Diaphragmatic hernia 09/30/2012  . Diverticular disease of large intestine 09/30/2012  . Long term current use of aspirin 09/30/2012  . Acid reflux 09/30/2012  . Essential (primary) hypertension 09/30/2012  . Cardiac disease 09/30/2012  . Hemorrhoid 09/30/2012  . HLD (hyperlipidemia) 09/30/2012  . Heart & renal disease, hypertensive, with heart failure ([place]) 09/30/2012  . Decreased potassium in the blood 09/30/2012  . LBP (low back pain) 09/30/2012  . Lumbosacral spondylosis 09/30/2012  . Arthralgia of lower leg 09/30/2012  . Type 2 diabetes mellitus ([place]) 09/30/2012  . Combined fat and carbohydrate induced hyperlipemia 12/19/2011  . Benign hypertension 05/18/2011  . Bilateral cataracts 05/18/2011  . Diabetes mellitus ([place]) 05/18/2011  . Apnea, sleep 05/18/2011  . Pituitary adenoma ([place]) 05/01/2011  . Diabetes mellitus 10/13/2010  . Hypertension 10/13/2010  . Muscle pain 10/13/2010  . Bilateral swelling of feet 10/13/2010  . Fatigue 10/13/2010  . Chest pain 10/13/2010  . Memory loss 10/13/2010  . Bruises easily 10/13/2010  . History of bladder problems 10/13/2010   Current Outpatient Prescriptions on File Prior to  Visit  Medication Sig Dispense Refill  . albuterol (PROVENTIL HFA;VENTOLIN HFA) 108 (90 BASE) MCG/ACT inhaler Inhale into the lungs.    Marland Kitchen aspirin 325 MG tablet Take 325 mg by mouth.    . Aspirin-Caffeine 500-32.5 MG TABS Take by mouth.    Marland Kitchen atenolol (TENORMIN) 50 MG tablet Take 50 mg by mouth.    . ATENOLOL PO Take by mouth daily.      Marland Kitchen atorvastatin (LIPITOR) 10 MG tablet Take 10 mg by mouth.    . baclofen (LIORESAL) 10 MG tablet Take 10 mg by mouth 3 (three) times daily as needed. for muscle spams  0  . baclofen (LIORESAL) 10 MG tablet Take 10 mg by mouth.    . budesonide (PULMICORT) 0.25 MG/2ML nebulizer solution 0.25 mg.    . budesonide (RHINOCORT AQUA) 32 MCG/ACT nasal spray Frequency:PHARMDIR   Dosage:0.0     Instructions:  Note:2 Sprays / nostril QHS. Dose: 2 SPRAYS    . budesonide (RHINOCORT AQUA) 32 MCG/ACT nasal spray Spray 2 squirts each nostril at bedtime    . budesonide (RHINOCORT AQUA) 32 MCG/ACT nasal spray Spray 2 squirts each nostril at bedtime    . capsaicin (ZOSTRIX) 0.025 % cream Frequency:PHARMDIR   Dosage:0.0     Instructions:  Note:Apply to knees up toTID as needed for pain.`E1o3L` Wash hands after use. Dose: 0.025 %    . CLONIDINE HCL PO Take by mouth 2 (two) times daily.      Marland Kitchen diltiazem (CARDIZEM SR) 120 MG 12 hr capsule Take 120 mg by mouth 2 (two) times daily.  1  . diltiazem (DILACOR XR) 120 MG 24  hr capsule Take 120 mg by mouth.    Marland Kitchen DILTIAZEM HCL PO Take by mouth 2 (two) times daily.      . Dutasteride (AVODART PO) Take by mouth.      . dutasteride (AVODART) 0.5 MG capsule Take 0.5 mg by mouth daily.      . finasteride (PROSCAR) 5 MG tablet Take 5 mg by mouth.    . finasteride (PROSCAR) 5 MG tablet Take 5 mg by mouth daily.  25  . Fluticasone-Salmeterol (ADVAIR DISKUS) 250-50 MCG/DOSE AEPB Inhale into the lungs.    . Fluticasone-Salmeterol (ADVAIR HFA IN) Inhale into the lungs.      Marland Kitchen glimepiride (AMARYL) 1 MG tablet Take 1 mg by mouth.    Marland Kitchen GLIMEPIRIDE PO  Take by mouth daily.      . hydrALAZINE (APRESOLINE) 50 MG tablet Take 50 mg by mouth.    . hydrALAZINE (APRESOLINE) 50 MG tablet Increased to 3 pills a day    . HYDRALAZINE HCL PO Take 50 mg by mouth 2 (two) times daily.      Marland Kitchen HYDROcodone-acetaminophen (VICODIN) 5-500 MG per tablet Take by mouth.    Marland Kitchen HYDROcodone-homatropine (HYCODAN) 5-1.5 MG/5ML syrup   0  . IPRATROPIUM BROMIDE NA Place into the nose every 4 (four) hours.      . Ipratropium-Albuterol (ALBUTEROL-IPRATROPIUM IN) Inhale into the lungs every 4 (four) hours.      Marland Kitchen labetalol (NORMODYNE) 200 MG tablet Take 200 mg by mouth.    Marland Kitchen lisinopril (PRINIVIL,ZESTRIL) 10 MG tablet Take 10 mg by mouth daily.  2  . lisinopril (PRINIVIL,ZESTRIL) 10 MG tablet Take 10 mg by mouth.    . loratadine (CLARITIN) 10 MG tablet Take 10 mg by mouth.    . loratadine (CLARITIN) 10 MG tablet Take 10 mg by mouth.    Marland Kitchen LORazepam (ATIVAN) 0.5 MG tablet   2  . meclizine (ANTIVERT) 12.5 MG tablet Take 12.5 mg by mouth.    . meloxicam (MOBIC) 7.5 MG tablet Take 7.5 mg by mouth daily. with food  3  . METFORMIN HCL PO Take by mouth daily.      . mometasone (ASMANEX 120 METERED DOSES) 220 MCG/INH inhaler Frequency:PHARMDIR   Dosage:0.0     Instructions:  Note:Inhale 1 inhalation twice daily. Rinse mouth after using`E1o3L` 3 month supply Dose: ONE    . mometasone (ASMANEX) 220 MCG/INH inhaler Inhale 2 puffs into the lungs daily.      . mometasone (ASMANEX) 220 MCG/INH inhaler Inhale into the lungs.    . mometasone-formoterol (DULERA) 100-5 MCG/ACT AERO Inhale into the lungs.    . montelukast (SINGULAIR) 10 MG tablet Take 10 mg by mouth.    . Montelukast Sodium (SINGULAIR PO) Take by mouth daily.      . nitroGLYCERIN (NITROSTAT) 0.4 MG SL tablet Place 0.4 mg under the tongue.    . Omega-3 Fatty Acids (OMEGA 3 PO) Take 2 capsules by mouth daily.      Marland Kitchen omeprazole (PRILOSEC) 20 MG capsule TAKE 1 CAPSULE DAILY    . Potassium Chlorate GRAN by Does not apply route 2  (two) times daily.      . potassium chloride SA (K-DUR,KLOR-CON) 20 MEQ tablet Take by mouth.    . potassium chloride SA (KLOR-CON M20) 20 MEQ tablet Take by mouth.    . Ranitidine HCl 150 MG PACK Take 150 mg by mouth 2 (two) times daily.      . solifenacin (VESICARE) 5 MG tablet Take 10 mg  by mouth.    . solifenacin (VESICARE) 5 MG tablet Take 5 mg by mouth.    . tamsulosin (FLOMAX) 0.4 MG CAPS capsule Take 0.4 mg by mouth.    . TAMSULOSIN HCL PO Take by mouth daily.      Marland Kitchen terazosin (HYTRIN) 2 MG capsule Take 2 mg by mouth.    . TERAZOSIN HCL PO Take 2 mg by mouth daily.      Marland Kitchen tiotropium (SPIRIVA HANDIHALER) 18 MCG inhalation capsule Place into inhaler and inhale.    . tiotropium (SPIRIVA) 18 MCG inhalation capsule Place into inhaler and inhale.    . tiotropium (SPIRIVA) 18 MCG inhalation capsule Place into inhaler and inhale.    . TRIAMTERENE PO Take by mouth daily.      Marland Kitchen triamterene-hydrochlorothiazide (MAXZIDE-25) 37.5-25 MG per tablet Take by mouth.     No current facility-administered medications on file prior to visit.   No Known Allergies  Labs: HEMOGLOBIN A1C- No recent lab on file   Objective: General: Patient is awake, alert, and oriented x 3 and in no acute distress.  Integument: Skin is warm, dry and supple bilateral. Nails are tender, long, thickened and  dystrophic with subungual debris, consistent with onychomycosis, 1-5 bilateral. No signs of infection. No open lesions or preulcerative lesions present bilateral. Mild reactive keratosis at left 2nd toe distal tuft. Remaining integument unremarkable.  Vasculature:  Dorsalis Pedis pulse 1/4 bilateral. Posterior Tibial pulse  1/4 bilateral.  Capillary fill time <3 sec 1-5 bilateral. Scant hair growth to the level of the digits. Temperature gradient within normal limits. No varicosities present bilateral. No edema present bilateral.   Neurology: The patient has diminished sensation measured with a 5.07/10g Semmes  Weinstein Monofilament at all pedal sites bilateral . Vibratory sensation diminished bilateral with tuning fork. No Babinski sign present bilateral.   Musculoskeletal: Asymptomatic hammertoes deformities noted bilateral. Muscular strength 5/5 in all lower extremity muscular groups bilateral without pain or limitation on range of motion . No tenderness with calf compression bilateral.  Assessment and Plan: Problem List Items Addressed This Visit    None    Visit Diagnoses    Pain due to onychomycosis of toenail    -  Primary    Type 2 diabetes, controlled, with neuropathy (HCC)        Porokeratosis        Hammertoe, unspecified laterality          -Examined patient. -Discussed and educated patient on diabetic foot care, especially with  regards to the vascular, neurological and musculoskeletal systems.  -Stressed the importance of good glycemic control and the detriment of not  controlling glucose levels in relation to the foot. -Mechanically debrided keratosis at tip of left 2nd toe using sterile chisel blade and all nails 1-5 bilateral using sterile nail nipper and filed with dremel without incident  -Recommend daily skin emollients.  -Cont with good supportive shoes daily.  -Answered all patient questions -Patient to return as needed or in 3 months for at risk foot care -Patient advised to call the office if any problems or questions arise in the meantime.  Landis Martins, DPM

## 2015-12-01 ENCOUNTER — Ambulatory Visit (INDEPENDENT_AMBULATORY_CARE_PROVIDER_SITE_OTHER): Payer: Medicare Other | Admitting: Sports Medicine

## 2015-12-01 ENCOUNTER — Encounter: Payer: Self-pay | Admitting: Sports Medicine

## 2015-12-01 DIAGNOSIS — M79676 Pain in unspecified toe(s): Secondary | ICD-10-CM

## 2015-12-01 DIAGNOSIS — B351 Tinea unguium: Secondary | ICD-10-CM

## 2015-12-01 DIAGNOSIS — E1140 Type 2 diabetes mellitus with diabetic neuropathy, unspecified: Secondary | ICD-10-CM

## 2015-12-01 DIAGNOSIS — M2040 Other hammer toe(s) (acquired), unspecified foot: Secondary | ICD-10-CM

## 2015-12-01 DIAGNOSIS — Q828 Other specified congenital malformations of skin: Secondary | ICD-10-CM

## 2015-12-01 NOTE — Progress Notes (Signed)
Patient ID: Richard Weeks, male   DOB: Oct 07, 1932, 82 y.o.   MRN: [id number]   Subjective: Richard Weeks is a 82 y.o. male patient with history of type 2 diabetes who presents to office today complaining of long, painful nails  while ambulating in shoes and callus; unable to trim. Patient states that the glucose reading this morning was "good". Patient denies any new changes in medication or new problems. Patient denies any new cramping, numbness, burning or tingling in the legs.  Patient had Right TKR and is consider getting left done.   Patient Active Problem List   Diagnosis Date Noted  . Obstructive apnea 11/03/2014  . Allergic rhinitis 09/30/2012  . Cardiac enlargement 09/30/2012  . Chronic obstructive pulmonary disease ([place]) 09/30/2012  . Diaphragmatic hernia 09/30/2012  . Diverticular disease of large intestine 09/30/2012  . Long term current use of aspirin 09/30/2012  . Acid reflux 09/30/2012  . Essential (primary) hypertension 09/30/2012  . Cardiac disease 09/30/2012  . Hemorrhoid 09/30/2012  . HLD (hyperlipidemia) 09/30/2012  . 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 Decreased potassium in the blood 09/30/2012  . LBP (low back pain) 09/30/2012  . Lumbosacral spondylosis 09/30/2012  . Arthralgia of lower leg 09/30/2012  . Type 2 diabetes mellitus ([place]) 09/30/2012  . Combined fat and carbohydrate induced hyperlipemia 12/19/2011  . Benign hypertension 05/18/2011  . Bilateral cataracts 05/18/2011  . Diabetes mellitus ([place]) 05/18/2011  . Apnea, sleep 05/18/2011  . Pituitary adenoma ([place]) 05/01/2011  . Diabetes mellitus 10/13/2010  . Hypertension 10/13/2010  . Muscle pain 10/13/2010  . Bilateral swelling of feet 10/13/2010  . Fatigue 10/13/2010  . Chest pain 10/13/2010  . Memory loss 10/13/2010  . Bruises easily 10/13/2010  . History of bladder problems 10/13/2010   Current Outpatient Prescriptions on File Prior to Visit  Medication Sig  Dispense Refill  . albuterol (PROVENTIL HFA;VENTOLIN HFA) 108 (90 BASE) MCG/ACT inhaler Inhale into the lungs.    Marland Kitchen aspirin 325 MG tablet Take 325 mg by mouth.    . Aspirin-Caffeine 500-32.5 MG TABS Take by mouth.    Marland Kitchen atenolol (TENORMIN) 50 MG tablet Take 50 mg by mouth.    . ATENOLOL PO Take by mouth daily.      Marland Kitchen atorvastatin (LIPITOR) 10 MG tablet Take 10 mg by mouth.    . baclofen (LIORESAL) 10 MG tablet Take 10 mg by mouth 3 (three) times daily as needed. for muscle spams  0  . baclofen (LIORESAL) 10 MG tablet Take 10 mg by mouth.    . budesonide (PULMICORT) 0.25 MG/2ML nebulizer solution 0.25 mg.    . budesonide (RHINOCORT AQUA) 32 MCG/ACT nasal spray Frequency:PHARMDIR   Dosage:0.0     Instructions:  Note:2 Sprays / nostril QHS. Dose: 2 SPRAYS    . budesonide (RHINOCORT AQUA) 32 MCG/ACT nasal spray Spray 2 squirts each nostril at bedtime    . budesonide (RHINOCORT AQUA) 32 MCG/ACT nasal spray Spray 2 squirts each nostril at bedtime    . capsaicin (ZOSTRIX) 0.025 % cream Frequency:PHARMDIR   Dosage:0.0     Instructions:  Note:Apply to knees up toTID as needed for pain.`E1o3L` Wash hands after use. Dose: 0.025 %    . CLONIDINE HCL PO Take by mouth 2 (two) times daily.      Marland Kitchen diltiazem (CARDIZEM SR) 120 MG 12 hr capsule Take 120 mg by mouth 2 (two) times daily.  1  . diltiazem (DILACOR XR) 120 MG 24 hr capsule Take  120 mg by mouth.    Marland Kitchen DILTIAZEM HCL PO Take by mouth 2 (two) times daily.      . Dutasteride (AVODART PO) Take by mouth.      . dutasteride (AVODART) 0.5 MG capsule Take 0.5 mg by mouth daily.      . finasteride (PROSCAR) 5 MG tablet Take 5 mg by mouth.    . finasteride (PROSCAR) 5 MG tablet Take 5 mg by mouth daily.  25  . Fluticasone-Salmeterol (ADVAIR DISKUS) 250-50 MCG/DOSE AEPB Inhale into the lungs.    . Fluticasone-Salmeterol (ADVAIR HFA IN) Inhale into the lungs.      Marland Kitchen glimepiride (AMARYL) 1 MG tablet Take 1 mg by mouth.    Marland Kitchen GLIMEPIRIDE PO Take by mouth daily.       . hydrALAZINE (APRESOLINE) 50 MG tablet Take 50 mg by mouth.    . hydrALAZINE (APRESOLINE) 50 MG tablet Increased to 3 pills a day    . HYDRALAZINE HCL PO Take 50 mg by mouth 2 (two) times daily.      Marland Kitchen HYDROcodone-acetaminophen (VICODIN) 5-500 MG per tablet Take by mouth.    Marland Kitchen HYDROcodone-homatropine (HYCODAN) 5-1.5 MG/5ML syrup   0  . IPRATROPIUM BROMIDE NA Place into the nose every 4 (four) hours.      . Ipratropium-Albuterol (ALBUTEROL-IPRATROPIUM IN) Inhale into the lungs every 4 (four) hours.      Marland Kitchen labetalol (NORMODYNE) 200 MG tablet Take 200 mg by mouth.    Marland Kitchen lisinopril (PRINIVIL,ZESTRIL) 10 MG tablet Take 10 mg by mouth daily.  2  . lisinopril (PRINIVIL,ZESTRIL) 10 MG tablet Take 10 mg by mouth.    . loratadine (CLARITIN) 10 MG tablet Take 10 mg by mouth.    . loratadine (CLARITIN) 10 MG tablet Take 10 mg by mouth.    Marland Kitchen LORazepam (ATIVAN) 0.5 MG tablet   2  . meclizine (ANTIVERT) 12.5 MG tablet Take 12.5 mg by mouth.    . meloxicam (MOBIC) 7.5 MG tablet Take 7.5 mg by mouth daily. with food  3  . METFORMIN HCL PO Take by mouth daily.      . mometasone (ASMANEX 120 METERED DOSES) 220 MCG/INH inhaler Frequency:PHARMDIR   Dosage:0.0     Instructions:  Note:Inhale 1 inhalation twice daily. Rinse mouth after using`E1o3L` 3 month supply Dose: ONE    . mometasone (ASMANEX) 220 MCG/INH inhaler Inhale 2 puffs into the lungs daily.      . mometasone (ASMANEX) 220 MCG/INH inhaler Inhale into the lungs.    . mometasone-formoterol (DULERA) 100-5 MCG/ACT AERO Inhale into the lungs.    . montelukast (SINGULAIR) 10 MG tablet Take 10 mg by mouth.    . Montelukast Sodium (SINGULAIR PO) Take by mouth daily.      . nitroGLYCERIN (NITROSTAT) 0.4 MG SL tablet Place 0.4 mg under the tongue.    . Omega-3 Fatty Acids (OMEGA 3 PO) Take 2 capsules by mouth daily.      Marland Kitchen omeprazole (PRILOSEC) 20 MG capsule TAKE 1 CAPSULE DAILY    . Potassium Chlorate GRAN by Does not apply route 2 (two) times daily.      .  potassium chloride SA (K-DUR,KLOR-CON) 20 MEQ tablet Take by mouth.    . potassium chloride SA (KLOR-CON M20) 20 MEQ tablet Take by mouth.    . Ranitidine HCl 150 MG PACK Take 150 mg by mouth 2 (two) times daily.      . solifenacin (VESICARE) 5 MG tablet Take 10 mg by mouth.    Marland Kitchen  solifenacin (VESICARE) 5 MG tablet Take 5 mg by mouth.    . tamsulosin (FLOMAX) 0.4 MG CAPS capsule Take 0.4 mg by mouth.    . TAMSULOSIN HCL PO Take by mouth daily.      Marland Kitchen terazosin (HYTRIN) 2 MG capsule Take 2 mg by mouth.    . TERAZOSIN HCL PO Take 2 mg by mouth daily.      Marland Kitchen tiotropium (SPIRIVA HANDIHALER) 18 MCG inhalation capsule Place into inhaler and inhale.    . tiotropium (SPIRIVA) 18 MCG inhalation capsule Place into inhaler and inhale.    . tiotropium (SPIRIVA) 18 MCG inhalation capsule Place into inhaler and inhale.    . TRIAMTERENE PO Take by mouth daily.      Marland Kitchen triamterene-hydrochlorothiazide (MAXZIDE-25) 37.5-25 MG per tablet Take by mouth.     No current facility-administered medications on file prior to visit.    No Known Allergies  Objective: General: Patient is awake, alert, and oriented x 3 and in no acute distress.  Integument: Skin is warm, dry and supple bilateral. Nails are tender, long, thickened and  dystrophic with subungual debris, consistent with onychomycosis, 1-5 bilateral. No signs of infection. No open lesions or preulcerative lesions present bilateral. Mild reactive keratosis at left 3rdtoe distal tuft. Remaining integument unremarkable.  Vasculature:  Dorsalis Pedis pulse 1/4 bilateral. Posterior Tibial pulse  1/4 bilateral.  Capillary fill time <3 sec 1-5 bilateral. Scant hair growth to the level of the digits. Temperature gradient within normal limits. No varicosities present bilateral. No edema present bilateral.   Neurology: The patient has diminished sensation measured with a 5.07/10g Semmes Weinstein Monofilament at all pedal sites bilateral . Vibratory sensation  diminished bilateral with tuning fork. No Babinski sign present bilateral.   Musculoskeletal: Asymptomatic hammertoes deformities noted bilateral. Muscular strength 5/5 in all lower extremity muscular groups bilateral without pain or limitation on range of motion . No tenderness with calf compression bilateral.  Assessment and Plan: Problem List Items Addressed This Visit    None    Visit Diagnoses    Pain due to onychomycosis of toenail    -  Primary   Type 2 diabetes, controlled, with neuropathy (HCC)       Porokeratosis       Hammertoe, unspecified laterality         -Examined patient. -Discussed and educated patient on diabetic foot care, especially with  regards to the vascular, neurological and musculoskeletal systems.  -Stressed the importance of good glycemic control and the detriment of not  controlling glucose levels in relation to the foot. -Mechanically debrided keratosis at tip of left 3rd toe using sterile chisel blade and all nails 1-5 bilateral using sterile nail nipper and filed with dremel without incident  -Recommend daily skin emollients.  -Cont with good supportive shoes daily.  -Answered all patient questions -Patient to return as needed or in 3 months for at risk foot care -Patient advised to call the office if any problems or questions arise in the meantime.  Landis Martins, DPM

## 2016-03-02 ENCOUNTER — Ambulatory Visit (INDEPENDENT_AMBULATORY_CARE_PROVIDER_SITE_OTHER): Payer: Medicare Other | Admitting: Sports Medicine

## 2016-03-02 ENCOUNTER — Encounter: Payer: Self-pay | Admitting: Sports Medicine

## 2016-03-02 DIAGNOSIS — B351 Tinea unguium: Secondary | ICD-10-CM

## 2016-03-02 DIAGNOSIS — M79676 Pain in unspecified toe(s): Secondary | ICD-10-CM

## 2016-03-02 DIAGNOSIS — E1140 Type 2 diabetes mellitus with diabetic neuropathy, unspecified: Secondary | ICD-10-CM | POA: Diagnosis not present

## 2016-03-02 DIAGNOSIS — Q828 Other specified congenital malformations of skin: Secondary | ICD-10-CM | POA: Diagnosis not present

## 2016-03-02 NOTE — Progress Notes (Signed)
Patient ID: Richard Weeks, male   DOB: [birth year], 82 y.o.   MRN: [id number]   Subjective: Richard Weeks is a 82 y.o. male patient with history of type 2 diabetes who presents to office today complaining of long, painful nails  while ambulating in shoes and callus; unable to trim. Patient states that the glucose reading this morning was "good", A1c unknown. Patient denies any new changes in medication or new problems. Patient denies any new cramping, numbness, burning or tingling in the legs.  Patient had Right TKR and is consider getting left done at some point.   Patient Active Problem List   Diagnosis Date Noted  . Obstructive apnea 11/03/2014  . Allergic rhinitis 09/30/2012  . Cardiac enlargement 09/30/2012  . Chronic obstructive pulmonary disease ([place]) 09/30/2012  . Diaphragmatic hernia 09/30/2012  . Diverticular disease of large intestine 09/30/2012  . Long term current use of aspirin 09/30/2012  . Acid reflux 09/30/2012  . Essential (primary) hypertension 09/30/2012  . Cardiac disease 09/30/2012  . Hemorrhoid 09/30/2012  . HLD (hyperlipidemia) 09/30/2012  . Heart & renal disease, hypertensive, with heart failure ([place]) 09/30/2012  . Decreased potassium in the blood 09/30/2012  . LBP (low back pain) 09/30/2012  . Lumbosacral spondylosis 09/30/2012  . Arthralgia of lower leg 09/30/2012  . Type 2 diabetes mellitus ([place]) 09/30/2012  . Combined fat and carbohydrate induced hyperlipemia 12/19/2011  . Benign hypertension 05/18/2011  . Bilateral cataracts 05/18/2011  . Diabetes mellitus ([place]) 05/18/2011  . Apnea, sleep 05/18/2011  . Pituitary adenoma ([place]) 05/01/2011  . Diabetes mellitus 10/13/2010  . Hypertension 10/13/2010  . Muscle pain 10/13/2010  . Bilateral swelling of feet 10/13/2010  . Fatigue 10/13/2010  . Chest pain 10/13/2010  . Memory loss 10/13/2010  . Bruises easily 10/13/2010  . History of bladder problems 10/13/2010   Current Outpatient Prescriptions on File Prior  to Visit  Medication Sig Dispense Refill  . albuterol (PROVENTIL HFA;VENTOLIN HFA) 108 (90 BASE) MCG/ACT inhaler Inhale into the lungs.    Marland Kitchen aspirin 325 MG tablet Take 325 mg by mouth.    . Aspirin-Caffeine 500-32.5 MG TABS Take by mouth.    Marland Kitchen atenolol (TENORMIN) 50 MG tablet Take 50 mg by mouth.    . ATENOLOL PO Take by mouth daily.      Marland Kitchen atorvastatin (LIPITOR) 10 MG tablet Take 10 mg by mouth.    . baclofen (LIORESAL) 10 MG tablet Take 10 mg by mouth 3 (three) times daily as needed. for muscle spams  0  . baclofen (LIORESAL) 10 MG tablet Take 10 mg by mouth.    . budesonide (PULMICORT) 0.25 MG/2ML nebulizer solution 0.25 mg.    . budesonide (RHINOCORT AQUA) 32 MCG/ACT nasal spray Frequency:PHARMDIR   Dosage:0.0     Instructions:  Note:2 Sprays / nostril QHS. Dose: 2 SPRAYS    . budesonide (RHINOCORT AQUA) 32 MCG/ACT nasal spray Spray 2 squirts each nostril at bedtime    . budesonide (RHINOCORT AQUA) 32 MCG/ACT nasal spray Spray 2 squirts each nostril at bedtime    . capsaicin (ZOSTRIX) 0.025 % cream Frequency:PHARMDIR   Dosage:0.0     Instructions:  Note:Apply to knees up toTID as needed for pain.`E1o3L` Wash hands after use. Dose: 0.025 %    . CLONIDINE HCL PO Take by mouth 2 (two) times daily.      Marland Kitchen diltiazem (CARDIZEM SR) 120 MG 12 hr capsule Take 120 mg by mouth 2 (two) times daily.  1  . diltiazem (DILACOR XR) 120  MG 24 hr capsule Take 120 mg by mouth.    Marland Kitchen DILTIAZEM HCL PO Take by mouth 2 (two) times daily.      . Dutasteride (AVODART PO) Take by mouth.      . dutasteride (AVODART) 0.5 MG capsule Take 0.5 mg by mouth daily.      . finasteride (PROSCAR) 5 MG tablet Take 5 mg by mouth.    . finasteride (PROSCAR) 5 MG tablet Take 5 mg by mouth daily.  25  . Fluticasone-Salmeterol (ADVAIR DISKUS) 250-50 MCG/DOSE AEPB Inhale into the lungs.    . Fluticasone-Salmeterol (ADVAIR HFA IN) Inhale into the lungs.      Marland Kitchen glimepiride (AMARYL) 1 MG tablet Take 1 mg by mouth.    Marland Kitchen GLIMEPIRIDE PO  Take by mouth daily.      . hydrALAZINE (APRESOLINE) 50 MG tablet Take 50 mg by mouth.    . hydrALAZINE (APRESOLINE) 50 MG tablet Increased to 3 pills a day    . HYDRALAZINE HCL PO Take 50 mg by mouth 2 (two) times daily.      Marland Kitchen HYDROcodone-acetaminophen (VICODIN) 5-500 MG per tablet Take by mouth.    Marland Kitchen HYDROcodone-homatropine (HYCODAN) 5-1.5 MG/5ML syrup   0  . IPRATROPIUM BROMIDE NA Place into the nose every 4 (four) hours.      . Ipratropium-Albuterol (ALBUTEROL-IPRATROPIUM IN) Inhale into the lungs every 4 (four) hours.      Marland Kitchen labetalol (NORMODYNE) 200 MG tablet Take 200 mg by mouth.    Marland Kitchen lisinopril (PRINIVIL,ZESTRIL) 10 MG tablet Take 10 mg by mouth daily.  2  . lisinopril (PRINIVIL,ZESTRIL) 10 MG tablet Take 10 mg by mouth.    . loratadine (CLARITIN) 10 MG tablet Take 10 mg by mouth.    . loratadine (CLARITIN) 10 MG tablet Take 10 mg by mouth.    Marland Kitchen LORazepam (ATIVAN) 0.5 MG tablet   2  . meclizine (ANTIVERT) 12.5 MG tablet Take 12.5 mg by mouth.    . meloxicam (MOBIC) 7.5 MG tablet Take 7.5 mg by mouth daily. with food  3  . METFORMIN HCL PO Take by mouth daily.      . mometasone (ASMANEX 120 METERED DOSES) 220 MCG/INH inhaler Frequency:PHARMDIR   Dosage:0.0     Instructions:  Note:Inhale 1 inhalation twice daily. Rinse mouth after using`E1o3L` 3 month supply Dose: ONE    . mometasone (ASMANEX) 220 MCG/INH inhaler Inhale 2 puffs into the lungs daily.      . mometasone (ASMANEX) 220 MCG/INH inhaler Inhale into the lungs.    . mometasone-formoterol (DULERA) 100-5 MCG/ACT AERO Inhale into the lungs.    . montelukast (SINGULAIR) 10 MG tablet Take 10 mg by mouth.    . Montelukast Sodium (SINGULAIR PO) Take by mouth daily.      . nitroGLYCERIN (NITROSTAT) 0.4 MG SL tablet Place 0.4 mg under the tongue.    . Omega-3 Fatty Acids (OMEGA 3 PO) Take 2 capsules by mouth daily.      Marland Kitchen omeprazole (PRILOSEC) 20 MG capsule TAKE 1 CAPSULE DAILY    . Potassium Chlorate GRAN by Does not apply route 2  (two) times daily.      . potassium chloride SA (K-DUR,KLOR-CON) 20 MEQ tablet Take by mouth.    . potassium chloride SA (KLOR-CON M20) 20 MEQ tablet Take by mouth.    . Ranitidine HCl 150 MG PACK Take 150 mg by mouth 2 (two) times daily.      . solifenacin (VESICARE) 5 MG tablet Take  10 mg by mouth.    . solifenacin (VESICARE) 5 MG tablet Take 5 mg by mouth.    . tamsulosin (FLOMAX) 0.4 MG CAPS capsule Take 0.4 mg by mouth.    . TAMSULOSIN HCL PO Take by mouth daily.      Marland Kitchen terazosin (HYTRIN) 2 MG capsule Take 2 mg by mouth.    . TERAZOSIN HCL PO Take 2 mg by mouth daily.      Marland Kitchen tiotropium (SPIRIVA HANDIHALER) 18 MCG inhalation capsule Place into inhaler and inhale.    . tiotropium (SPIRIVA) 18 MCG inhalation capsule Place into inhaler and inhale.    . tiotropium (SPIRIVA) 18 MCG inhalation capsule Place into inhaler and inhale.    . TRIAMTERENE PO Take by mouth daily.      Marland Kitchen triamterene-hydrochlorothiazide (MAXZIDE-25) 37.5-25 MG per tablet Take by mouth.     No current facility-administered medications on file prior to visit.    No Known Allergies  Objective: General: Patient is awake, alert, and oriented x 3 and in no acute distress.  Integument: Skin is warm, dry and supple bilateral. Nails are tender, long, thickened and dystrophic with subungual debris, consistent with onychomycosis, 1-5 bilateral. No signs of infection. No open lesions present bilateral. Mild reactive keratosis at left 3rdtoe distal tuft. Remaining integument unremarkable.  Vasculature:  Dorsalis Pedis pulse 1/4 bilateral. Posterior Tibial pulse  1/4 bilateral. Capillary fill time <3 sec 1-5 bilateral. Scant hair growth to the level of the digits.Temperature gradient within normal limits. No varicosities present bilateral. No edema present bilateral.   Neurology: The patient has diminished sensation measured with a 5.07/10g Semmes Weinstein Monofilament at all pedal sites bilateral . Vibratory sensation diminished  bilateral with tuning fork. No Babinski sign present bilateral.   Musculoskeletal: Asymptomatic hammertoes deformities noted bilateral. Muscular strength 5/5 in all lower extremity muscular groups bilateral without pain or limitation on range of motion . No tenderness with calf compression bilateral.  Assessment and Plan: Problem List Items Addressed This Visit    None    Visit Diagnoses    Pain due to onychomycosis of toenail    -  Primary   Porokeratosis       Type 2 diabetes, controlled, with neuropathy ([place])         -Examined patient. -Discussed and educated patient on diabetic foot care, especially with  regards to the vascular, neurological and musculoskeletal systems.  -Stressed the importance of good glycemic control and the detriment of not  controlling glucose levels in relation to the foot. -Mechanically debrided keratosis at tip of left 3rd toe and all nails 1-5 bilateral using sterile nail nipper and filed with dremel without incident  - Gave silicone toe cap, left 3rd toe -Recommend daily skin emollients.  -Cont with good supportive shoes daily.  -Answered all patient questions -Patient to return as needed or in 2.5 months for at risk foot care -Patient advised to call the office if any problems or questions arise in the meantime.  Landis Martins, DPM

## 2016-05-11 ENCOUNTER — Ambulatory Visit: Payer: Medicare Other | Admitting: Sports Medicine

## 2016-06-23 ENCOUNTER — Encounter: Payer: Self-pay | Admitting: Sports Medicine

## 2016-06-23 ENCOUNTER — Ambulatory Visit (INDEPENDENT_AMBULATORY_CARE_PROVIDER_SITE_OTHER): Payer: Medicare Other | Admitting: Sports Medicine

## 2016-06-23 DIAGNOSIS — E1140 Type 2 diabetes mellitus with diabetic neuropathy, unspecified: Secondary | ICD-10-CM | POA: Diagnosis not present

## 2016-06-23 DIAGNOSIS — M79676 Pain in unspecified toe(s): Secondary | ICD-10-CM

## 2016-06-23 DIAGNOSIS — Q828 Other specified congenital malformations of skin: Secondary | ICD-10-CM

## 2016-06-23 DIAGNOSIS — B351 Tinea unguium: Secondary | ICD-10-CM

## 2016-06-23 NOTE — Progress Notes (Signed)
Patient ID: Richard Weeks, male   DOB: 1932-12-07, 83 y.o.   MRN: [id number]   Subjective: Richard Weeks is a 83 y.o. male patient with history of type 2 diabetes who presents to office today complaining of long, painful nails  while ambulating in shoes and callus; unable to trim. Patient states that the glucose reading this morning was "good", A1c unknown. Patient denies any new changes in medication or new problems. Patient denies any new cramping, numbness, burning or tingling in the legs.  Patient had Right TKR and is consider getting left done at some point, but has not yet decided.   Patient Active Problem List   Diagnosis Date Noted  . Obstructive apnea 11/03/2014  . Allergic rhinitis 09/30/2012  . Cardiac enlargement 09/30/2012  . Chronic obstructive pulmonary disease ([place]) 09/30/2012  . Diaphragmatic hernia 09/30/2012  . Diverticular disease of large intestine 09/30/2012  . Long term current use of aspirin 09/30/2012  . Acid reflux 09/30/2012  . Essential (primary) hypertension 09/30/2012  . Cardiac disease 09/30/2012  . Hemorrhoid 09/30/2012  . HLD (hyperlipidemia) 09/30/2012  . Heart & renal disease, hypertensive, with heart failure ([place]) 09/30/2012  . Decreased potassium in the blood 09/30/2012  . LBP (low back pain) 09/30/2012  . Lumbosacral spondylosis 09/30/2012  . Arthralgia of lower leg 09/30/2012  . Type 2 diabetes mellitus ([place]) 09/30/2012  . Combined fat and carbohydrate induced hyperlipemia 12/19/2011  . Benign hypertension 05/18/2011  . Bilateral cataracts 05/18/2011  . Diabetes mellitus ([place]) 05/18/2011  . Apnea, sleep 05/18/2011  . Pituitary adenoma ([place]) 05/01/2011  . Diabetes mellitus 10/13/2010  . Hypertension 10/13/2010  . Muscle pain 10/13/2010  . Bilateral swelling of feet 10/13/2010  . Fatigue 10/13/2010  . Chest pain 10/13/2010  . Memory loss 10/13/2010  . Bruises easily 10/13/2010  . History of bladder problems 10/13/2010   Current Outpatient  Prescriptions on File Prior to Visit  Medication Sig Dispense Refill  . albuterol (PROVENTIL HFA;VENTOLIN HFA) 108 (90 BASE) MCG/ACT inhaler Inhale into the lungs.    Marland Kitchen aspirin 325 MG tablet Take 325 mg by mouth.    . Aspirin-Caffeine 500-32.5 MG TABS Take by mouth.    Marland Kitchen atenolol (TENORMIN) 50 MG tablet Take 50 mg by mouth.    . ATENOLOL PO Take by mouth daily.      Marland Kitchen atorvastatin (LIPITOR) 10 MG tablet Take 10 mg by mouth.    . baclofen (LIORESAL) 10 MG tablet Take 10 mg by mouth 3 (three) times daily as needed. for muscle spams  0  . baclofen (LIORESAL) 10 MG tablet Take 10 mg by mouth.    . budesonide (PULMICORT) 0.25 MG/2ML nebulizer solution 0.25 mg.    . budesonide (RHINOCORT AQUA) 32 MCG/ACT nasal spray Frequency:PHARMDIR   Dosage:0.0     Instructions:  Note:2 Sprays / nostril QHS. Dose: 2 SPRAYS    . budesonide (RHINOCORT AQUA) 32 MCG/ACT nasal spray Spray 2 squirts each nostril at bedtime    . budesonide (RHINOCORT AQUA) 32 MCG/ACT nasal spray Spray 2 squirts each nostril at bedtime    . capsaicin (ZOSTRIX) 0.025 % cream Frequency:PHARMDIR   Dosage:0.0     Instructions:  Note:Apply to knees up toTID as needed for pain.`E1o3L` Wash hands after use. Dose: 0.025 %    . CLONIDINE HCL PO Take by mouth 2 (two) times daily.      Marland Kitchen diltiazem (CARDIZEM SR) 120 MG 12 hr capsule Take 120 mg by mouth 2 (two) times daily.  1  .  diltiazem (DILACOR XR) 120 MG 24 hr capsule Take 120 mg by mouth.    Marland Kitchen DILTIAZEM HCL PO Take by mouth 2 (two) times daily.      . Dutasteride (AVODART PO) Take by mouth.      . dutasteride (AVODART) 0.5 MG capsule Take 0.5 mg by mouth daily.      . finasteride (PROSCAR) 5 MG tablet Take 5 mg by mouth.    . finasteride (PROSCAR) 5 MG tablet Take 5 mg by mouth daily.  25  . Fluticasone-Salmeterol (ADVAIR DISKUS) 250-50 MCG/DOSE AEPB Inhale into the lungs.    . Fluticasone-Salmeterol (ADVAIR HFA IN) Inhale into the lungs.      Marland Kitchen glimepiride (AMARYL) 1 MG tablet Take 1 mg by  mouth.    Marland Kitchen GLIMEPIRIDE PO Take by mouth daily.      . hydrALAZINE (APRESOLINE) 50 MG tablet Take 50 mg by mouth.    . hydrALAZINE (APRESOLINE) 50 MG tablet Increased to 3 pills a day    . HYDRALAZINE HCL PO Take 50 mg by mouth 2 (two) times daily.      Marland Kitchen HYDROcodone-acetaminophen (VICODIN) 5-500 MG per tablet Take by mouth.    Marland Kitchen HYDROcodone-homatropine (HYCODAN) 5-1.5 MG/5ML syrup   0  . IPRATROPIUM BROMIDE NA Place into the nose every 4 (four) hours.      . Ipratropium-Albuterol (ALBUTEROL-IPRATROPIUM IN) Inhale into the lungs every 4 (four) hours.      Marland Kitchen labetalol (NORMODYNE) 200 MG tablet Take 200 mg by mouth.    Marland Kitchen lisinopril (PRINIVIL,ZESTRIL) 10 MG tablet Take 10 mg by mouth daily.  2  . lisinopril (PRINIVIL,ZESTRIL) 10 MG tablet Take 10 mg by mouth.    . loratadine (CLARITIN) 10 MG tablet Take 10 mg by mouth.    . loratadine (CLARITIN) 10 MG tablet Take 10 mg by mouth.    Marland Kitchen LORazepam (ATIVAN) 0.5 MG tablet   2  . meclizine (ANTIVERT) 12.5 MG tablet Take 12.5 mg by mouth.    . meloxicam (MOBIC) 7.5 MG tablet Take 7.5 mg by mouth daily. with food  3  . METFORMIN HCL PO Take by mouth daily.      . mometasone (ASMANEX 120 METERED DOSES) 220 MCG/INH inhaler Frequency:PHARMDIR   Dosage:0.0     Instructions:  Note:Inhale 1 inhalation twice daily. Rinse mouth after using`E1o3L` 3 month supply Dose: ONE    . mometasone (ASMANEX) 220 MCG/INH inhaler Inhale 2 puffs into the lungs daily.      . mometasone (ASMANEX) 220 MCG/INH inhaler Inhale into the lungs.    . mometasone-formoterol (DULERA) 100-5 MCG/ACT AERO Inhale into the lungs.    . montelukast (SINGULAIR) 10 MG tablet Take 10 mg by mouth.    . Montelukast Sodium (SINGULAIR PO) Take by mouth daily.      . nitroGLYCERIN (NITROSTAT) 0.4 MG SL tablet Place 0.4 mg under the tongue.    . Omega-3 Fatty Acids (OMEGA 3 PO) Take 2 capsules by mouth daily.      Marland Kitchen omeprazole (PRILOSEC) 20 MG capsule TAKE 1 CAPSULE DAILY    . Potassium Chlorate GRAN  by Does not apply route 2 (two) times daily.      . potassium chloride SA (K-DUR,KLOR-CON) 20 MEQ tablet Take by mouth.    . potassium chloride SA (KLOR-CON M20) 20 MEQ tablet Take by mouth.    . Ranitidine HCl 150 MG PACK Take 150 mg by mouth 2 (two) times daily.      . solifenacin (VESICARE)  5 MG tablet Take 10 mg by mouth.    . solifenacin (VESICARE) 5 MG tablet Take 5 mg by mouth.    . tamsulosin (FLOMAX) 0.4 MG CAPS capsule Take 0.4 mg by mouth.    . TAMSULOSIN HCL PO Take by mouth daily.      Marland Kitchen terazosin (HYTRIN) 2 MG capsule Take 2 mg by mouth.    . TERAZOSIN HCL PO Take 2 mg by mouth daily.      Marland Kitchen tiotropium (SPIRIVA HANDIHALER) 18 MCG inhalation capsule Place into inhaler and inhale.    . tiotropium (SPIRIVA) 18 MCG inhalation capsule Place into inhaler and inhale.    . tiotropium (SPIRIVA) 18 MCG inhalation capsule Place into inhaler and inhale.    . TRIAMTERENE PO Take by mouth daily.      Marland Kitchen triamterene-hydrochlorothiazide (MAXZIDE-25) 37.5-25 MG per tablet Take by mouth.     No current facility-administered medications on file prior to visit.    No Known Allergies  Objective: General: Patient is awake, alert, and oriented x 3 and in no acute distress.  Integument: Skin is warm, dry and supple bilateral. Nails are tender, long, thickened and dystrophic with subungual debris, consistent with onychomycosis, 1-5 bilateral. No signs of infection. No open lesions present bilateral. Mild reactive keratosis at left 3rdtoe distal tuft. Remaining integument unremarkable.  Vasculature:  Dorsalis Pedis pulse 1/4 bilateral. Posterior Tibial pulse  1/4 bilateral. Capillary fill time <3 sec 1-5 bilateral. Scant hair growth to the level of the digits.Temperature gradient within normal limits. No varicosities present bilateral. No edema present bilateral.   Neurology: The patient has diminished sensation measured with a 5.07/10g Semmes Weinstein Monofilament at all pedal sites bilateral .  Vibratory sensation diminished bilateral with tuning fork. No Babinski sign present bilateral.   Musculoskeletal: Asymptomatic hammertoes deformities noted bilateral. Muscular strength 5/5 in all lower extremity muscular groups bilateral without pain or limitation on range of motion . No tenderness with calf compression bilateral.  Assessment and Plan: Problem List Items Addressed This Visit    None    Visit Diagnoses    Pain due to onychomycosis of toenail    -  Primary   Porokeratosis       Type 2 diabetes, controlled, with neuropathy ([place])         -Examined patient. -Discussed and educated patient on diabetic foot care, especially with  regards to the vascular, neurological and musculoskeletal systems.  -Stressed the importance of good glycemic control and the detriment of not controlling glucose levels in relation to the foot. -Mechanically debrided keratosis at tip of left 3rd toe and all nails 1-5 bilateral using sterile nail nipper and filed with dremel without incident  - Continue with silicone toe cap, left 3rd toe -Recommend daily skin emollients.  -Cont with good supportive shoes daily.  -Answered all patient questions -Patient to return as needed or in 2.5 months for at risk foot care -Patient advised to call the office if any problems or questions arise in the meantime.  Landis Martins, DPM

## 2016-09-27 ENCOUNTER — Ambulatory Visit (INDEPENDENT_AMBULATORY_CARE_PROVIDER_SITE_OTHER): Payer: Medicare Other | Admitting: Sports Medicine

## 2016-09-27 ENCOUNTER — Encounter: Payer: Self-pay | Admitting: Sports Medicine

## 2016-09-27 VITALS — BP 141/71 | HR 63

## 2016-09-27 DIAGNOSIS — E1140 Type 2 diabetes mellitus with diabetic neuropathy, unspecified: Secondary | ICD-10-CM

## 2016-09-27 DIAGNOSIS — Q828 Other specified congenital malformations of skin: Secondary | ICD-10-CM

## 2016-09-27 DIAGNOSIS — M79676 Pain in unspecified toe(s): Secondary | ICD-10-CM | POA: Diagnosis not present

## 2016-09-27 DIAGNOSIS — B351 Tinea unguium: Secondary | ICD-10-CM

## 2016-09-27 NOTE — Progress Notes (Signed)
Patient ID: Jahlon Baines, male   DOB: 1933/01/25, 83 y.o.   MRN: [id number]   Subjective: Jomes Giraldo is a 83 y.o. male patient with history of type 2 diabetes who presents to office today complaining of long, painful nails  while ambulating in shoes and callus; unable to trim. Patient states that the glucose reading this morning was "good". Patient denies any new changes in medication or new problems.  Patient Active Problem List   Diagnosis Date Noted  . Obstructive apnea 11/03/2014  . Allergic rhinitis 09/30/2012  . Cardiac enlargement 09/30/2012  . Chronic obstructive pulmonary disease ([place]) 09/30/2012  . Diaphragmatic hernia 09/30/2012  . Diverticular disease of large intestine 09/30/2012  . Long term current use of aspirin 09/30/2012  . Acid reflux 09/30/2012  . Essential (primary) hypertension 09/30/2012  . Cardiac disease 09/30/2012  . Hemorrhoid 09/30/2012  . HLD (hyperlipidemia) 09/30/2012  . Heart & renal disease, hypertensive, with heart failure ([place]) 09/30/2012  . Decreased potassium in the blood 09/30/2012  . LBP (low back pain) 09/30/2012  . Lumbosacral spondylosis 09/30/2012  . Arthralgia of lower leg 09/30/2012  . Type 2 diabetes mellitus ([place]) 09/30/2012  . Combined fat and carbohydrate induced hyperlipemia 12/19/2011  . Benign hypertension 05/18/2011  . Bilateral cataracts 05/18/2011  . Diabetes mellitus ([place]) 05/18/2011  . Apnea, sleep 05/18/2011  . Pituitary adenoma ([place]) 05/01/2011  . Diabetes mellitus 10/13/2010  . Hypertension 10/13/2010  . Muscle pain 10/13/2010  . Bilateral swelling of feet 10/13/2010  . Fatigue 10/13/2010  . Chest pain 10/13/2010  . Memory loss 10/13/2010  . Bruises easily 10/13/2010  . History of bladder problems 10/13/2010   Current Outpatient Prescriptions on File Prior to Visit  Medication Sig Dispense Refill  . albuterol (PROVENTIL HFA;VENTOLIN HFA) 108 (90 BASE) MCG/ACT inhaler Inhale into the lungs.    Marland Kitchen aspirin 325 MG tablet  Take 325 mg by mouth.    . Aspirin-Caffeine 500-32.5 MG TABS Take by mouth.    Marland Kitchen atenolol (TENORMIN) 50 MG tablet Take 50 mg by mouth.    . ATENOLOL PO Take by mouth daily.      Marland Kitchen atorvastatin (LIPITOR) 10 MG tablet Take 10 mg by mouth.    . baclofen (LIORESAL) 10 MG tablet Take 10 mg by mouth 3 (three) times daily as needed. for muscle spams  0  . baclofen (LIORESAL) 10 MG tablet Take 10 mg by mouth.    . budesonide (PULMICORT) 0.25 MG/2ML nebulizer solution 0.25 mg.    . budesonide (RHINOCORT AQUA) 32 MCG/ACT nasal spray Frequency:PHARMDIR   Dosage:0.0     Instructions:  Note:2 Sprays / nostril QHS. Dose: 2 SPRAYS    . capsaicin (ZOSTRIX) 0.025 % cream Frequency:PHARMDIR   Dosage:0.0     Instructions:  Note:Apply to knees up toTID as needed for pain.`E1o3L` Wash hands after use. Dose: 0.025 %    . CLONIDINE HCL PO Take by mouth 2 (two) times daily.      Marland Kitchen diltiazem (CARDIZEM SR) 120 MG 12 hr capsule Take 120 mg by mouth 2 (two) times daily.  1  . DILTIAZEM HCL PO Take by mouth 2 (two) times daily.      Marland Kitchen dutasteride (AVODART) 0.5 MG capsule Take 0.5 mg by mouth daily.      . finasteride (PROSCAR) 5 MG tablet Take 5 mg by mouth daily.  25  . Fluticasone-Salmeterol (ADVAIR DISKUS) 250-50 MCG/DOSE AEPB Inhale into the lungs.    . Fluticasone-Salmeterol (ADVAIR HFA IN) Inhale into the  lungs.      . glimepiride (AMARYL) 1 MG tablet Take 1 mg by mouth.    Marland Kitchen GLIMEPIRIDE PO Take by mouth daily.      . hydrALAZINE (APRESOLINE) 50 MG tablet Take 50 mg by mouth.    . hydrALAZINE (APRESOLINE) 50 MG tablet Increased to 3 pills a day    . HYDRALAZINE HCL PO Take 50 mg by mouth 2 (two) times daily.      Marland Kitchen HYDROcodone-acetaminophen (VICODIN) 5-500 MG per tablet Take by mouth.    Marland Kitchen HYDROcodone-homatropine (HYCODAN) 5-1.5 MG/5ML syrup   0  . IPRATROPIUM BROMIDE NA Place into the nose every 4 (four) hours.      . Ipratropium-Albuterol (ALBUTEROL-IPRATROPIUM IN) Inhale into the lungs every 4 (four) hours.       Marland Kitchen labetalol (NORMODYNE) 200 MG tablet Take 200 mg by mouth.    Marland Kitchen lisinopril (PRINIVIL,ZESTRIL) 10 MG tablet Take 10 mg by mouth daily.  2  . lisinopril (PRINIVIL,ZESTRIL) 10 MG tablet Take 10 mg by mouth.    . loratadine (CLARITIN) 10 MG tablet Take 10 mg by mouth.    . loratadine (CLARITIN) 10 MG tablet Take 10 mg by mouth.    Marland Kitchen LORazepam (ATIVAN) 0.5 MG tablet   2  . meclizine (ANTIVERT) 12.5 MG tablet Take 12.5 mg by mouth.    . meloxicam (MOBIC) 7.5 MG tablet Take 7.5 mg by mouth daily. with food  3  . METFORMIN HCL PO Take by mouth daily.      . mometasone (ASMANEX 120 METERED DOSES) 220 MCG/INH inhaler Frequency:PHARMDIR   Dosage:0.0     Instructions:  Note:Inhale 1 inhalation twice daily. Rinse mouth after using`E1o3L` 3 month supply Dose: ONE    . mometasone (ASMANEX) 220 MCG/INH inhaler Inhale 2 puffs into the lungs daily.      . mometasone (ASMANEX) 220 MCG/INH inhaler Inhale into the lungs.    . mometasone-formoterol (DULERA) 100-5 MCG/ACT AERO Inhale into the lungs.    . montelukast (SINGULAIR) 10 MG tablet Take 10 mg by mouth.    . Montelukast Sodium (SINGULAIR PO) Take by mouth daily.      . Omega-3 Fatty Acids (OMEGA 3 PO) Take 2 capsules by mouth daily.      Marland Kitchen omeprazole (PRILOSEC) 20 MG capsule TAKE 1 CAPSULE DAILY    . Potassium Chlorate GRAN by Does not apply route 2 (two) times daily.      . potassium chloride SA (K-DUR,KLOR-CON) 20 MEQ tablet Take by mouth.    . potassium chloride SA (KLOR-CON M20) 20 MEQ tablet Take by mouth.    . Ranitidine HCl 150 MG PACK Take 150 mg by mouth 2 (two) times daily.      . solifenacin (VESICARE) 5 MG tablet Take 10 mg by mouth.    . solifenacin (VESICARE) 5 MG tablet Take 5 mg by mouth.    . tamsulosin (FLOMAX) 0.4 MG CAPS capsule Take 0.4 mg by mouth.    . TAMSULOSIN HCL PO Take by mouth daily.      Marland Kitchen terazosin (HYTRIN) 2 MG capsule Take 2 mg by mouth.    . TERAZOSIN HCL PO Take 2 mg by mouth daily.      Marland Kitchen tiotropium (SPIRIVA  HANDIHALER) 18 MCG inhalation capsule Place into inhaler and inhale.    . tiotropium (SPIRIVA) 18 MCG inhalation capsule Place into inhaler and inhale.    . tiotropium (SPIRIVA) 18 MCG inhalation capsule Place into inhaler and inhale.    Marland Kitchen  TRIAMTERENE PO Take by mouth daily.      Marland Kitchen triamterene-hydrochlorothiazide (MAXZIDE-25) 37.5-25 MG per tablet Take by mouth.    . budesonide (RHINOCORT AQUA) 32 MCG/ACT nasal spray Spray 2 squirts each nostril at bedtime    . budesonide (RHINOCORT AQUA) 32 MCG/ACT nasal spray Spray 2 squirts each nostril at bedtime    . diltiazem (DILACOR XR) 120 MG 24 hr capsule Take 120 mg by mouth.    . Dutasteride (AVODART PO) Take by mouth.      . finasteride (PROSCAR) 5 MG tablet Take 5 mg by mouth.    . nitroGLYCERIN (NITROSTAT) 0.4 MG SL tablet Place 0.4 mg under the tongue.     No current facility-administered medications on file prior to visit.    No Known Allergies  Objective: General: Patient is awake, alert, and oriented x 3 and in no acute distress.  Integument: Skin is warm, dry and supple bilateral. Nails are tender, long, thickened and dystrophic with subungual debris, consistent with onychomycosis, 1-5 bilateral. No signs of infection. No open lesions present bilateral. Mild reactive keratosis at left 3rdtoe distal tuft. Remaining integument unremarkable.  Vasculature:  Dorsalis Pedis pulse 1/4 bilateral. Posterior Tibial pulse  1/4 bilateral. Capillary fill time <3 sec 1-5 bilateral. Scant hair growth to the level of the digits.Temperature gradient within normal limits. No varicosities present bilateral. No edema present bilateral.   Neurology: The patient has diminished sensation measured with a 5.07/10g Semmes Weinstein Monofilament at all pedal sites bilateral. Vibratory sensation diminished bilateral with tuning fork. No Babinski sign present bilateral.   Musculoskeletal: Asymptomatic hammertoes deformities noted and pes planus bilateral. Muscular  strength 5/5 in all lower extremity muscular groups bilateral without pain or limitation on range of motion . No tenderness with calf compression bilateral.  Assessment and Plan: Problem List Items Addressed This Visit    None    Visit Diagnoses    Pain due to onychomycosis of toenail    -  Primary   Porokeratosis       Type 2 diabetes, controlled, with neuropathy ([place])         -Examined patient. -Discussed and educated patient on diabetic foot care, especially with  regards to the vascular, neurological and musculoskeletal systems.  -Stressed the importance of good glycemic control and the detriment of not controlling glucose levels in relation to the foot. -Mechanically debrided keratosis at tip of left 3rd toe and all nails 1-5 bilateral using sterile nail nipper and filed with dremel without incident  -Continue with silicone toe cap, left 3rd toe -Recommend daily skin emollients.  -Cont with good supportive shoes daily. [employer] diabetic shoe order form was completed; office to contact primary care for approval / certification;  Office to arrange shoe fitting and dispensing. -Answered all patient questions -Patient to return as needed or in 2.5 months for at risk foot care -Patient advised to call the office if any problems or questions arise in the meantime.  Landis Martins, DPM

## 2017-01-03 ENCOUNTER — Ambulatory Visit (INDEPENDENT_AMBULATORY_CARE_PROVIDER_SITE_OTHER): Payer: Medicare Other | Admitting: Sports Medicine

## 2017-01-03 ENCOUNTER — Encounter (INDEPENDENT_AMBULATORY_CARE_PROVIDER_SITE_OTHER): Payer: Self-pay

## 2017-01-03 DIAGNOSIS — L84 Corns and callosities: Secondary | ICD-10-CM

## 2017-01-03 DIAGNOSIS — M79676 Pain in unspecified toe(s): Secondary | ICD-10-CM | POA: Diagnosis not present

## 2017-01-03 DIAGNOSIS — B351 Tinea unguium: Secondary | ICD-10-CM

## 2017-01-03 DIAGNOSIS — M2141 Flat foot [pes planus] (acquired), right foot: Secondary | ICD-10-CM

## 2017-01-03 DIAGNOSIS — M2041 Other hammer toe(s) (acquired), right foot: Secondary | ICD-10-CM

## 2017-01-03 DIAGNOSIS — M2142 Flat foot [pes planus] (acquired), left foot: Secondary | ICD-10-CM

## 2017-01-03 DIAGNOSIS — M21619 Bunion of unspecified foot: Secondary | ICD-10-CM

## 2017-01-03 DIAGNOSIS — M2042 Other hammer toe(s) (acquired), left foot: Secondary | ICD-10-CM

## 2017-01-03 DIAGNOSIS — E1140 Type 2 diabetes mellitus with diabetic neuropathy, unspecified: Secondary | ICD-10-CM

## 2017-01-03 NOTE — Progress Notes (Signed)
Patient ID: Richard Weeks, male   DOB: 05/31/1932, 83 y.o.   MRN: [id number]   Subjective: Richard Weeks is a 83 y.o. male patient with history of type 2 diabetes who presents to office today complaining of long, painful nails  while ambulating in shoes and callus; unable to trim. Patient states that the glucose reading this morning was "good". Patient denies any new changes in medication or new problems since last visit.  Patient Active Problem List   Diagnosis Date Noted  . Obstructive apnea 11/03/2014  . Allergic rhinitis 09/30/2012  . Cardiac enlargement 09/30/2012  . Chronic obstructive pulmonary disease ([place]) 09/30/2012  . Diaphragmatic hernia 09/30/2012  . Diverticular disease of large intestine 09/30/2012  . Long term current use of aspirin 09/30/2012  . Acid reflux 09/30/2012  . Essential (primary) hypertension 09/30/2012  . Cardiac disease 09/30/2012  . Hemorrhoid 09/30/2012  . HLD (hyperlipidemia) 09/30/2012  . Heart & renal disease, hypertensive, with heart failure ([place]) 09/30/2012  . Decreased potassium in the blood 09/30/2012  . LBP (low back pain) 09/30/2012  . Lumbosacral spondylosis 09/30/2012  . Arthralgia of lower leg 09/30/2012  . Type 2 diabetes mellitus ([place]) 09/30/2012  . Combined fat and carbohydrate induced hyperlipemia 12/19/2011  . Benign hypertension 05/18/2011  . Bilateral cataracts 05/18/2011  . Diabetes mellitus ([place]) 05/18/2011  . Apnea, sleep 05/18/2011  . Pituitary adenoma ([place]) 05/01/2011  . Diabetes mellitus 10/13/2010  . Hypertension 10/13/2010  . Muscle pain 10/13/2010  . Bilateral swelling of feet 10/13/2010  . Fatigue 10/13/2010  . Chest pain 10/13/2010  . Memory loss 10/13/2010  . Bruises easily 10/13/2010  . History of bladder problems 10/13/2010   Current Outpatient Prescriptions on File Prior to Visit  Medication Sig Dispense Refill  . albuterol (PROVENTIL HFA;VENTOLIN HFA) 108 (90 BASE) MCG/ACT inhaler Inhale into the lungs.    Marland Kitchen  aspirin 325 MG tablet Take 325 mg by mouth.    . Aspirin-Caffeine 500-32.5 MG TABS Take by mouth.    Marland Kitchen atenolol (TENORMIN) 50 MG tablet Take 50 mg by mouth.    . ATENOLOL PO Take by mouth daily.      Marland Kitchen atorvastatin (LIPITOR) 10 MG tablet Take 10 mg by mouth.    . baclofen (LIORESAL) 10 MG tablet Take 10 mg by mouth 3 (three) times daily as needed. for muscle spams  0  . baclofen (LIORESAL) 10 MG tablet Take 10 mg by mouth.    . budesonide (PULMICORT) 0.25 MG/2ML nebulizer solution 0.25 mg.    . budesonide (RHINOCORT AQUA) 32 MCG/ACT nasal spray Frequency:PHARMDIR   Dosage:0.0     Instructions:  Note:2 Sprays / nostril QHS. Dose: 2 SPRAYS    . budesonide (RHINOCORT AQUA) 32 MCG/ACT nasal spray Spray 2 squirts each nostril at bedtime    . budesonide (RHINOCORT AQUA) 32 MCG/ACT nasal spray Spray 2 squirts each nostril at bedtime    . capsaicin (ZOSTRIX) 0.025 % cream Frequency:PHARMDIR   Dosage:0.0     Instructions:  Note:Apply to knees up toTID as needed for pain.`E1o3L` Wash hands after use. Dose: 0.025 %    . CLONIDINE HCL PO Take by mouth 2 (two) times daily.      Marland Kitchen diltiazem (CARDIZEM SR) 120 MG 12 hr capsule Take 120 mg by mouth 2 (two) times daily.  1  . diltiazem (DILACOR XR) 120 MG 24 hr capsule Take 120 mg by mouth.    Marland Kitchen DILTIAZEM HCL PO Take by mouth 2 (two) times daily.      Marland Kitchen  Dutasteride (AVODART PO) Take by mouth.      . dutasteride (AVODART) 0.5 MG capsule Take 0.5 mg by mouth daily.      . finasteride (PROSCAR) 5 MG tablet Take 5 mg by mouth.    . finasteride (PROSCAR) 5 MG tablet Take 5 mg by mouth daily.  25  . Fluticasone-Salmeterol (ADVAIR DISKUS) 250-50 MCG/DOSE AEPB Inhale into the lungs.    . Fluticasone-Salmeterol (ADVAIR HFA IN) Inhale into the lungs.      Marland Kitchen glimepiride (AMARYL) 1 MG tablet Take 1 mg by mouth.    Marland Kitchen GLIMEPIRIDE PO Take by mouth daily.      . hydrALAZINE (APRESOLINE) 50 MG tablet Take 50 mg by mouth.    . hydrALAZINE (APRESOLINE) 50 MG tablet Increased to  3 pills a day    . HYDRALAZINE HCL PO Take 50 mg by mouth 2 (two) times daily.      Marland Kitchen HYDROcodone-acetaminophen (VICODIN) 5-500 MG per tablet Take by mouth.    Marland Kitchen HYDROcodone-homatropine (HYCODAN) 5-1.5 MG/5ML syrup   0  . IPRATROPIUM BROMIDE NA Place into the nose every 4 (four) hours.      . Ipratropium-Albuterol (ALBUTEROL-IPRATROPIUM IN) Inhale into the lungs every 4 (four) hours.      Marland Kitchen labetalol (NORMODYNE) 200 MG tablet Take 200 mg by mouth.    Marland Kitchen lisinopril (PRINIVIL,ZESTRIL) 10 MG tablet Take 10 mg by mouth daily.  2  . lisinopril (PRINIVIL,ZESTRIL) 10 MG tablet Take 10 mg by mouth.    . loratadine (CLARITIN) 10 MG tablet Take 10 mg by mouth.    . loratadine (CLARITIN) 10 MG tablet Take 10 mg by mouth.    Marland Kitchen LORazepam (ATIVAN) 0.5 MG tablet   2  . meclizine (ANTIVERT) 12.5 MG tablet Take 12.5 mg by mouth.    . meloxicam (MOBIC) 7.5 MG tablet Take 7.5 mg by mouth daily. with food  3  . METFORMIN HCL PO Take by mouth daily.      . mometasone (ASMANEX 120 METERED DOSES) 220 MCG/INH inhaler Frequency:PHARMDIR   Dosage:0.0     Instructions:  Note:Inhale 1 inhalation twice daily. Rinse mouth after using`E1o3L` 3 month supply Dose: ONE    . mometasone (ASMANEX) 220 MCG/INH inhaler Inhale 2 puffs into the lungs daily.      . mometasone (ASMANEX) 220 MCG/INH inhaler Inhale into the lungs.    . mometasone-formoterol (DULERA) 100-5 MCG/ACT AERO Inhale into the lungs.    . montelukast (SINGULAIR) 10 MG tablet Take 10 mg by mouth.    . Montelukast Sodium (SINGULAIR PO) Take by mouth daily.      . nitroGLYCERIN (NITROSTAT) 0.4 MG SL tablet Place 0.4 mg under the tongue.    . Omega-3 Fatty Acids (OMEGA 3 PO) Take 2 capsules by mouth daily.      Marland Kitchen omeprazole (PRILOSEC) 20 MG capsule TAKE 1 CAPSULE DAILY    . Potassium Chlorate GRAN by Does not apply route 2 (two) times daily.      . potassium chloride SA (K-DUR,KLOR-CON) 20 MEQ tablet Take by mouth.    . potassium chloride SA (KLOR-CON M20) 20 MEQ  tablet Take by mouth.    . Ranitidine HCl 150 MG PACK Take 150 mg by mouth 2 (two) times daily.      . solifenacin (VESICARE) 5 MG tablet Take 10 mg by mouth.    . solifenacin (VESICARE) 5 MG tablet Take 5 mg by mouth.    . tamsulosin (FLOMAX) 0.4 MG CAPS capsule Take  0.4 mg by mouth.    . TAMSULOSIN HCL PO Take by mouth daily.      Marland Kitchen terazosin (HYTRIN) 2 MG capsule Take 2 mg by mouth.    . TERAZOSIN HCL PO Take 2 mg by mouth daily.      Marland Kitchen tiotropium (SPIRIVA HANDIHALER) 18 MCG inhalation capsule Place into inhaler and inhale.    . tiotropium (SPIRIVA) 18 MCG inhalation capsule Place into inhaler and inhale.    . tiotropium (SPIRIVA) 18 MCG inhalation capsule Place into inhaler and inhale.    . TRIAMTERENE PO Take by mouth daily.      Marland Kitchen triamterene-hydrochlorothiazide (MAXZIDE-25) 37.5-25 MG per tablet Take by mouth.     No current facility-administered medications on file prior to visit.    No Known Allergies  Objective: General: Patient is awake, alert, and oriented x 3 and in no acute distress.  Integument: Skin is warm, dry and supple bilateral. Nails are tender, long, thickened and dystrophic with subungual debris, consistent with onychomycosis, 1-5 bilateral. No signs of infection. No open lesions present bilateral. Mild reactive keratosis at left 3rdtoe distal tuft. Remaining integument unremarkable.  Vasculature:  Dorsalis Pedis pulse 1/4 bilateral. Posterior Tibial pulse  1/4 bilateral. Capillary fill time <3 sec 1-5 bilateral. Scant hair growth to the level of the digits.Temperature gradient within normal limits. No varicosities present bilateral. No edema present bilateral.   Neurology: The patient has diminished sensation measured with a 5.07/10g Semmes Weinstein Monofilament at all pedal sites bilateral. Vibratory sensation diminished bilateral with tuning fork. No Babinski sign present bilateral.   Musculoskeletal: Asymptomatic bunion,  hammertoes deformities and pes planus  bilateral. Muscular strength 5/5 in all lower extremity muscular groups bilateral without pain or limitation on range of motion . No tenderness with calf compression bilateral.  Assessment and Plan: Problem List Items Addressed This Visit    None    Visit Diagnoses    Pain due to onychomycosis of toenail    -  Primary   Corn of toe       Type 2 diabetes, controlled, with neuropathy ([facility])       Bunion       Hammer toes of both feet       Pes planus of both feet         -Examined patient. -Discussed and educated patient on diabetic foot care, especially with  regards to the vascular, neurological and musculoskeletal systems.  -Stressed the importance of good glycemic control and the detriment of not controlling glucose levels in relation to the foot. -Mechanically debrided keratosis at tip of left 3rd toe using rotary bur and all nails 1-5 bilateral using sterile nail nipper and filed with dremel without incident  -Continue with silicone toe cap, left 3rd toe -Recommend daily skin emollients.  -Cont with good supportive shoes daily. [employer] diabetic shoe order form was completed again for patient updated pco; office to contact primary care for approval / certification;  Office to arrange shoe fitting and dispensing. -Answered all patient questions -Patient to return as needed or in 2.5 to 15months for at risk foot care -Patient advised to call the office if any problems or questions arise in the meantime.  Landis Martins, DPM

## 2017-03-08 ENCOUNTER — Ambulatory Visit: Payer: Medicare Other | Admitting: *Deleted

## 2017-03-08 DIAGNOSIS — M2042 Other hammer toe(s) (acquired), left foot: Secondary | ICD-10-CM

## 2017-03-08 DIAGNOSIS — M21619 Bunion of unspecified foot: Secondary | ICD-10-CM

## 2017-03-08 DIAGNOSIS — L84 Corns and callosities: Secondary | ICD-10-CM

## 2017-03-08 DIAGNOSIS — E1140 Type 2 diabetes mellitus with diabetic neuropathy, unspecified: Secondary | ICD-10-CM

## 2017-03-08 DIAGNOSIS — M2041 Other hammer toe(s) (acquired), right foot: Secondary | ICD-10-CM

## 2017-03-09 NOTE — Progress Notes (Signed)
Patient ID: Richard Weeks, male   DOB: May 06, 1932, 83 y.o.   MRN: [id number]   Patient presents to be measured for diabetic shoes and inserts with [facility] CPed.  Patient measures as 12 medium on the brannock device.  Patient has chosen the Apex Y900M Ariya moc toe.  We will call when shoes and inserts arrive

## 2017-04-04 ENCOUNTER — Ambulatory Visit (INDEPENDENT_AMBULATORY_CARE_PROVIDER_SITE_OTHER): Payer: Medicare Other | Admitting: Sports Medicine

## 2017-04-04 ENCOUNTER — Encounter: Payer: Self-pay | Admitting: Sports Medicine

## 2017-04-04 DIAGNOSIS — M79676 Pain in unspecified toe(s): Secondary | ICD-10-CM

## 2017-04-04 DIAGNOSIS — M2141 Flat foot [pes planus] (acquired), right foot: Secondary | ICD-10-CM | POA: Diagnosis not present

## 2017-04-04 DIAGNOSIS — M2041 Other hammer toe(s) (acquired), right foot: Secondary | ICD-10-CM | POA: Diagnosis not present

## 2017-04-04 DIAGNOSIS — M2142 Flat foot [pes planus] (acquired), left foot: Secondary | ICD-10-CM | POA: Diagnosis not present

## 2017-04-04 DIAGNOSIS — M2042 Other hammer toe(s) (acquired), left foot: Secondary | ICD-10-CM

## 2017-04-04 DIAGNOSIS — B351 Tinea unguium: Secondary | ICD-10-CM | POA: Diagnosis not present

## 2017-04-04 DIAGNOSIS — E1140 Type 2 diabetes mellitus with diabetic neuropathy, unspecified: Secondary | ICD-10-CM | POA: Diagnosis not present

## 2017-04-04 DIAGNOSIS — M21619 Bunion of unspecified foot: Secondary | ICD-10-CM | POA: Diagnosis not present

## 2017-04-04 NOTE — Progress Notes (Signed)
Patient ID: Ishmel Acevedo, male   DOB: Aug 12, 1932, 84 y.o.   MRN: [id number]   Subjective: Gaylan Fauver is a 83 y.o. male patient with history of type 2 diabetes who presents to office today complaining of long, painful nails  while ambulating in shoes and callus; unable to trim. Patient states that the glucose reading this morning was "good", no changes since last visit. Patient denies any new changes in medication or new problems since last visit.  Patient Active Problem List   Diagnosis Date Noted  . Obstructive apnea 11/03/2014  . Allergic rhinitis 09/30/2012  . Cardiac enlargement 09/30/2012  . Chronic obstructive pulmonary disease ([place]) 09/30/2012  . Diaphragmatic hernia 09/30/2012  . Diverticular disease of large intestine 09/30/2012  . Long term current use of aspirin 09/30/2012  . Acid reflux 09/30/2012  . Essential (primary) hypertension 09/30/2012  . Cardiac disease 09/30/2012  . Hemorrhoid 09/30/2012  . HLD (hyperlipidemia) 09/30/2012  . Heart & renal disease, hypertensive, with heart failure ([place]) 09/30/2012  . Decreased potassium in the blood 09/30/2012  . LBP (low back pain) 09/30/2012  . Lumbosacral spondylosis 09/30/2012  . Arthralgia of lower leg 09/30/2012  . Type 2 diabetes mellitus ([place]) 09/30/2012  . Combined fat and carbohydrate induced hyperlipemia 12/19/2011  . Benign hypertension 05/18/2011  . Bilateral cataracts 05/18/2011  . Diabetes mellitus ([place]) 05/18/2011  . Apnea, sleep 05/18/2011  . Pituitary adenoma ([place]) 05/01/2011  . Diabetes mellitus 10/13/2010  . Hypertension 10/13/2010  . Muscle pain 10/13/2010  . Bilateral swelling of feet 10/13/2010  . Fatigue 10/13/2010  . Chest pain 10/13/2010  . Memory loss 10/13/2010  . Bruises easily 10/13/2010  . History of bladder problems 10/13/2010   Current Outpatient Medications on File Prior to Visit  Medication Sig Dispense Refill  . albuterol (PROVENTIL HFA;VENTOLIN HFA) 108 (90 BASE) MCG/ACT inhaler Inhale  into the lungs.    Marland Kitchen aspirin 325 MG tablet Take 325 mg by mouth.    . Aspirin-Caffeine 500-32.5 MG TABS Take by mouth.    Marland Kitchen atenolol (TENORMIN) 50 MG tablet Take 50 mg by mouth.    . ATENOLOL PO Take by mouth daily.      Marland Kitchen atorvastatin (LIPITOR) 10 MG tablet Take 10 mg by mouth.    . baclofen (LIORESAL) 10 MG tablet Take 10 mg by mouth 3 (three) times daily as needed. for muscle spams  0  . baclofen (LIORESAL) 10 MG tablet Take 10 mg by mouth.    . budesonide (PULMICORT) 0.25 MG/2ML nebulizer solution 0.25 mg.    . budesonide (RHINOCORT AQUA) 32 MCG/ACT nasal spray Frequency:PHARMDIR   Dosage:0.0     Instructions:  Note:2 Sprays / nostril QHS. Dose: 2 SPRAYS    . budesonide (RHINOCORT AQUA) 32 MCG/ACT nasal spray Spray 2 squirts each nostril at bedtime    . budesonide (RHINOCORT AQUA) 32 MCG/ACT nasal spray Spray 2 squirts each nostril at bedtime    . capsaicin (ZOSTRIX) 0.025 % cream Frequency:PHARMDIR   Dosage:0.0     Instructions:  Note:Apply to knees up toTID as needed for pain.`E1o3L` Wash hands after use. Dose: 0.025 %    . CLONIDINE HCL PO Take by mouth 2 (two) times daily.      Marland Kitchen diltiazem (CARDIZEM SR) 120 MG 12 hr capsule Take 120 mg by mouth 2 (two) times daily.  1  . diltiazem (DILACOR XR) 120 MG 24 hr capsule Take 120 mg by mouth.    Marland Kitchen DILTIAZEM HCL PO Take by mouth 2 (two)  times daily.      . Dutasteride (AVODART PO) Take by mouth.      . dutasteride (AVODART) 0.5 MG capsule Take 0.5 mg by mouth daily.      . finasteride (PROSCAR) 5 MG tablet Take 5 mg by mouth.    . finasteride (PROSCAR) 5 MG tablet Take 5 mg by mouth daily.  25  . Fluticasone-Salmeterol (ADVAIR DISKUS) 250-50 MCG/DOSE AEPB Inhale into the lungs.    . Fluticasone-Salmeterol (ADVAIR HFA IN) Inhale into the lungs.      Marland Kitchen glimepiride (AMARYL) 1 MG tablet Take 1 mg by mouth.    Marland Kitchen GLIMEPIRIDE PO Take by mouth daily.      . hydrALAZINE (APRESOLINE) 50 MG tablet Take 50 mg by mouth.    . hydrALAZINE (APRESOLINE) 50  MG tablet Increased to 3 pills a day    . HYDRALAZINE HCL PO Take 50 mg by mouth 2 (two) times daily.      Marland Kitchen HYDROcodone-acetaminophen (VICODIN) 5-500 MG per tablet Take by mouth.    Marland Kitchen HYDROcodone-homatropine (HYCODAN) 5-1.5 MG/5ML syrup   0  . IPRATROPIUM BROMIDE NA Place into the nose every 4 (four) hours.      . Ipratropium-Albuterol (ALBUTEROL-IPRATROPIUM IN) Inhale into the lungs every 4 (four) hours.      Marland Kitchen labetalol (NORMODYNE) 200 MG tablet Take 200 mg by mouth.    Marland Kitchen lisinopril (PRINIVIL,ZESTRIL) 10 MG tablet Take 10 mg by mouth daily.  2  . lisinopril (PRINIVIL,ZESTRIL) 10 MG tablet Take 10 mg by mouth.    . loratadine (CLARITIN) 10 MG tablet Take 10 mg by mouth.    . loratadine (CLARITIN) 10 MG tablet Take 10 mg by mouth.    Marland Kitchen LORazepam (ATIVAN) 0.5 MG tablet   2  . meclizine (ANTIVERT) 12.5 MG tablet Take 12.5 mg by mouth.    . meloxicam (MOBIC) 7.5 MG tablet Take 7.5 mg by mouth daily. with food  3  . METFORMIN HCL PO Take by mouth daily.      . mometasone (ASMANEX 120 METERED DOSES) 220 MCG/INH inhaler Frequency:PHARMDIR   Dosage:0.0     Instructions:  Note:Inhale 1 inhalation twice daily. Rinse mouth after using`E1o3L` 3 month supply Dose: ONE    . mometasone (ASMANEX) 220 MCG/INH inhaler Inhale 2 puffs into the lungs daily.      . mometasone (ASMANEX) 220 MCG/INH inhaler Inhale into the lungs.    . mometasone-formoterol (DULERA) 100-5 MCG/ACT AERO Inhale into the lungs.    . montelukast (SINGULAIR) 10 MG tablet Take 10 mg by mouth.    . Montelukast Sodium (SINGULAIR PO) Take by mouth daily.      . nitroGLYCERIN (NITROSTAT) 0.4 MG SL tablet Place 0.4 mg under the tongue.    . Omega-3 Fatty Acids (OMEGA 3 PO) Take 2 capsules by mouth daily.      Marland Kitchen omeprazole (PRILOSEC) 20 MG capsule TAKE 1 CAPSULE DAILY    . Potassium Chlorate GRAN by Does not apply route 2 (two) times daily.      . potassium chloride SA (K-DUR,KLOR-CON) 20 MEQ tablet Take by mouth.    . potassium chloride SA  (KLOR-CON M20) 20 MEQ tablet Take by mouth.    . Ranitidine HCl 150 MG PACK Take 150 mg by mouth 2 (two) times daily.      . solifenacin (VESICARE) 5 MG tablet Take 10 mg by mouth.    . solifenacin (VESICARE) 5 MG tablet Take 5 mg by mouth.    Marland Kitchen  tamsulosin (FLOMAX) 0.4 MG CAPS capsule Take 0.4 mg by mouth.    . TAMSULOSIN HCL PO Take by mouth daily.      Marland Kitchen terazosin (HYTRIN) 2 MG capsule Take 2 mg by mouth.    . TERAZOSIN HCL PO Take 2 mg by mouth daily.      Marland Kitchen tiotropium (SPIRIVA HANDIHALER) 18 MCG inhalation capsule Place into inhaler and inhale.    . tiotropium (SPIRIVA) 18 MCG inhalation capsule Place into inhaler and inhale.    . tiotropium (SPIRIVA) 18 MCG inhalation capsule Place into inhaler and inhale.    . TRIAMTERENE PO Take by mouth daily.      Marland Kitchen triamterene-hydrochlorothiazide (MAXZIDE-25) 37.5-25 MG per tablet Take by mouth.     No current facility-administered medications on file prior to visit.    No Known Allergies  Objective: General: Patient is awake, alert, and oriented x 3 and in no acute distress.  Integument: Skin is warm, dry and supple bilateral. Nails are tender, long, thickened and dystrophic with subungual debris, consistent with onychomycosis, 1-5 bilateral. No signs of infection. No open lesions present bilateral. Mild reactive keratosis at left 3rdtoe distal tuft. Remaining integument unremarkable.  Vasculature:  Dorsalis Pedis pulse 1/4 bilateral. Posterior Tibial pulse  1/4 bilateral. Capillary fill time <3 sec 1-5 bilateral. Scant hair growth to the level of the digits.Temperature gradient within normal limits. No varicosities present bilateral. No edema present bilateral.   Neurology: The patient has diminished sensation measured with a 5.07/10g Semmes Weinstein Monofilament at all pedal sites bilateral. Vibratory sensation diminished bilateral with tuning fork. No Babinski sign present bilateral.   Musculoskeletal: Asymptomatic bunion,  hammertoes  deformities and pes planus bilateral. Muscular strength 5/5 in all lower extremity muscular groups bilateral without pain or limitation on range of motion . No tenderness with calf compression bilateral.  Assessment and Plan: Problem List Items Addressed This Visit    None    Visit Diagnoses    Pain due to onychomycosis of toenail    -  Primary   Type 2 diabetes, controlled, with neuropathy ([facility])       Bunion       Hammer toes of both feet       Pes planus of both feet         -Examined patient. -Discussed and educated patient on diabetic foot care, especially with  regards to the vascular, neurological and musculoskeletal systems.  -Stressed the importance of good glycemic control and the detriment of not controlling glucose levels in relation to the foot. -Mechanically debrided keratosis at tip of left 3rd toe using rotary bur and all nails 1-5 bilateral using sterile nail nipper and filed with dremel without incident  -Continue with silicone toe cap, left 3rd toe -Recommend daily skin emollients.  -Cont with good supportive shoes daily; Awaiting Diabetic shoes office to call when they arrive -Answered all patient questions -Patient to return as needed or in 2.5 to 36months for at risk foot care -Patient advised to call the office if any problems or questions arise in the meantime.  Landis Martins, DPM

## 2017-04-12 ENCOUNTER — Ambulatory Visit (INDEPENDENT_AMBULATORY_CARE_PROVIDER_SITE_OTHER): Payer: Medicare Other | Admitting: *Deleted

## 2017-04-12 DIAGNOSIS — M21619 Bunion of unspecified foot: Secondary | ICD-10-CM

## 2017-04-12 DIAGNOSIS — M2141 Flat foot [pes planus] (acquired), right foot: Secondary | ICD-10-CM | POA: Diagnosis not present

## 2017-04-12 DIAGNOSIS — M2142 Flat foot [pes planus] (acquired), left foot: Secondary | ICD-10-CM | POA: Diagnosis not present

## 2017-04-12 DIAGNOSIS — M2041 Other hammer toe(s) (acquired), right foot: Secondary | ICD-10-CM | POA: Diagnosis not present

## 2017-04-12 DIAGNOSIS — E1140 Type 2 diabetes mellitus with diabetic neuropathy, unspecified: Secondary | ICD-10-CM

## 2017-04-12 DIAGNOSIS — M2042 Other hammer toe(s) (acquired), left foot: Secondary | ICD-10-CM

## 2017-04-12 NOTE — Patient Instructions (Signed)

## 2017-04-23 NOTE — Progress Notes (Signed)
Patient ID: Richard Weeks, male   DOB: 07/30/1932, 83 y.o.   MRN: [id number]  Patient presents for diabetic shoe pick up, shoes are tried on for good fit.  Patient received 1 pair Apex Y900 Ariya Casual walker black moc toe in men's size 12 medium and 3 pairs custom molded diabetic inserts.  Verbal and written break in and wear instructions given.  Patient will follow up for scheduled routine care.

## 2017-07-04 ENCOUNTER — Ambulatory Visit: Payer: Medicare Other | Admitting: Sports Medicine

## 2017-07-11 ENCOUNTER — Encounter: Payer: Self-pay | Admitting: Sports Medicine

## 2017-07-11 ENCOUNTER — Ambulatory Visit (INDEPENDENT_AMBULATORY_CARE_PROVIDER_SITE_OTHER): Payer: Medicare Other | Admitting: Sports Medicine

## 2017-07-11 DIAGNOSIS — M2141 Flat foot [pes planus] (acquired), right foot: Secondary | ICD-10-CM

## 2017-07-11 DIAGNOSIS — B351 Tinea unguium: Secondary | ICD-10-CM

## 2017-07-11 DIAGNOSIS — M2042 Other hammer toe(s) (acquired), left foot: Secondary | ICD-10-CM

## 2017-07-11 DIAGNOSIS — M79676 Pain in unspecified toe(s): Secondary | ICD-10-CM | POA: Diagnosis not present

## 2017-07-11 DIAGNOSIS — M2041 Other hammer toe(s) (acquired), right foot: Secondary | ICD-10-CM

## 2017-07-11 DIAGNOSIS — E1140 Type 2 diabetes mellitus with diabetic neuropathy, unspecified: Secondary | ICD-10-CM

## 2017-07-11 DIAGNOSIS — L84 Corns and callosities: Secondary | ICD-10-CM

## 2017-07-11 DIAGNOSIS — M21619 Bunion of unspecified foot: Secondary | ICD-10-CM

## 2017-07-11 DIAGNOSIS — M2142 Flat foot [pes planus] (acquired), left foot: Secondary | ICD-10-CM

## 2017-07-11 NOTE — Progress Notes (Signed)
Patient ID: Richard Weeks, male   DOB: 05/29/1932, 84 y.o.   MRN: [id number]   Subjective: Richard Weeks is a 84 y.o. male patient with history of type 2 diabetes who presents to office today complaining of long, painful nails  while ambulating in shoes and callus; unable to trim. Patient states that the glucose reading this morning was not recorded and can not recall PCP name right now. Patient denies any new changes in medication or new problems since last visit.  Patient Active Problem List   Diagnosis Date Noted  . Obstructive apnea 11/03/2014  . Allergic rhinitis 09/30/2012  . Cardiac enlargement 09/30/2012  . Chronic obstructive pulmonary disease ([place]) 09/30/2012  . Diaphragmatic hernia 09/30/2012  . Diverticular disease of large intestine 09/30/2012  . Long term current use of aspirin 09/30/2012  . Acid reflux 09/30/2012  . Essential (primary) hypertension 09/30/2012  . Cardiac disease 09/30/2012  . Hemorrhoid 09/30/2012  . HLD (hyperlipidemia) 09/30/2012  . Heart & renal disease, hypertensive, with heart failure ([place]) 09/30/2012  . Decreased potassium in the blood 09/30/2012  . LBP (low back pain) 09/30/2012  . Lumbosacral spondylosis 09/30/2012  . Arthralgia of lower leg 09/30/2012  . Type 2 diabetes mellitus ([place]) 09/30/2012  . Combined fat and carbohydrate induced hyperlipemia 12/19/2011  . Benign hypertension 05/18/2011  . Bilateral cataracts 05/18/2011  . Diabetes mellitus ([place]) 05/18/2011  . Apnea, sleep 05/18/2011  . Pituitary adenoma ([place]) 05/01/2011  . Diabetes mellitus 10/13/2010  . Hypertension 10/13/2010  . Muscle pain 10/13/2010  . Bilateral swelling of feet 10/13/2010  . Fatigue 10/13/2010  . Chest pain 10/13/2010  . Memory loss 10/13/2010  . Bruises easily 10/13/2010  . History of bladder problems 10/13/2010   Current Outpatient Medications on File Prior to Visit  Medication Sig Dispense Refill  . albuterol (PROVENTIL HFA;VENTOLIN HFA) 108 (90 BASE) MCG/ACT  inhaler Inhale into the lungs.    Marland Kitchen aspirin 325 MG tablet Take 325 mg by mouth.    . Aspirin-Caffeine 500-32.5 MG TABS Take by mouth.    Marland Kitchen atenolol (TENORMIN) 50 MG tablet Take 50 mg by mouth.    . ATENOLOL PO Take by mouth daily.      Marland Kitchen atorvastatin (LIPITOR) 10 MG tablet Take 10 mg by mouth.    . baclofen (LIORESAL) 10 MG tablet Take 10 mg by mouth 3 (three) times daily as needed. for muscle spams  0  . baclofen (LIORESAL) 10 MG tablet Take 10 mg by mouth.    . budesonide (PULMICORT) 0.25 MG/2ML nebulizer solution 0.25 mg.    . budesonide (RHINOCORT AQUA) 32 MCG/ACT nasal spray Frequency:PHARMDIR   Dosage:0.0     Instructions:  Note:2 Sprays / nostril QHS. Dose: 2 SPRAYS    . budesonide (RHINOCORT AQUA) 32 MCG/ACT nasal spray Spray 2 squirts each nostril at bedtime    . budesonide (RHINOCORT AQUA) 32 MCG/ACT nasal spray Spray 2 squirts each nostril at bedtime    . capsaicin (ZOSTRIX) 0.025 % cream Frequency:PHARMDIR   Dosage:0.0     Instructions:  Note:Apply to knees up toTID as needed for pain.`E1o3L` Wash hands after use. Dose: 0.025 %    . CLONIDINE HCL PO Take by mouth 2 (two) times daily.      Marland Kitchen diltiazem (CARDIZEM SR) 120 MG 12 hr capsule Take 120 mg by mouth 2 (two) times daily.  1  . diltiazem (DILACOR XR) 120 MG 24 hr capsule Take 120 mg by mouth.    Marland Kitchen DILTIAZEM HCL PO Take  by mouth 2 (two) times daily.      . Dutasteride (AVODART PO) Take by mouth.      . dutasteride (AVODART) 0.5 MG capsule Take 0.5 mg by mouth daily.      . finasteride (PROSCAR) 5 MG tablet Take 5 mg by mouth.    . finasteride (PROSCAR) 5 MG tablet Take 5 mg by mouth daily.  25  . Fluticasone-Salmeterol (ADVAIR DISKUS) 250-50 MCG/DOSE AEPB Inhale into the lungs.    . Fluticasone-Salmeterol (ADVAIR HFA IN) Inhale into the lungs.      Marland Kitchen glimepiride (AMARYL) 1 MG tablet Take 1 mg by mouth.    Marland Kitchen GLIMEPIRIDE PO Take by mouth daily.      . hydrALAZINE (APRESOLINE) 50 MG tablet Take 50 mg by mouth.    . hydrALAZINE  (APRESOLINE) 50 MG tablet Increased to 3 pills a day    . HYDRALAZINE HCL PO Take 50 mg by mouth 2 (two) times daily.      Marland Kitchen HYDROcodone-acetaminophen (VICODIN) 5-500 MG per tablet Take by mouth.    Marland Kitchen HYDROcodone-homatropine (HYCODAN) 5-1.5 MG/5ML syrup   0  . IPRATROPIUM BROMIDE NA Place into the nose every 4 (four) hours.      . Ipratropium-Albuterol (ALBUTEROL-IPRATROPIUM IN) Inhale into the lungs every 4 (four) hours.      Marland Kitchen labetalol (NORMODYNE) 200 MG tablet Take 200 mg by mouth.    Marland Kitchen lisinopril (PRINIVIL,ZESTRIL) 10 MG tablet Take 10 mg by mouth daily.  2  . lisinopril (PRINIVIL,ZESTRIL) 10 MG tablet Take 10 mg by mouth.    . loratadine (CLARITIN) 10 MG tablet Take 10 mg by mouth.    . loratadine (CLARITIN) 10 MG tablet Take 10 mg by mouth.    Marland Kitchen LORazepam (ATIVAN) 0.5 MG tablet   2  . meclizine (ANTIVERT) 12.5 MG tablet Take 12.5 mg by mouth.    . meloxicam (MOBIC) 7.5 MG tablet Take 7.5 mg by mouth daily. with food  3  . METFORMIN HCL PO Take by mouth daily.      . mometasone (ASMANEX 120 METERED DOSES) 220 MCG/INH inhaler Frequency:PHARMDIR   Dosage:0.0     Instructions:  Note:Inhale 1 inhalation twice daily. Rinse mouth after using`E1o3L` 3 month supply Dose: ONE    . mometasone (ASMANEX) 220 MCG/INH inhaler Inhale 2 puffs into the lungs daily.      . mometasone (ASMANEX) 220 MCG/INH inhaler Inhale into the lungs.    . mometasone-formoterol (DULERA) 100-5 MCG/ACT AERO Inhale into the lungs.    . montelukast (SINGULAIR) 10 MG tablet Take 10 mg by mouth.    . Montelukast Sodium (SINGULAIR PO) Take by mouth daily.      . nitroGLYCERIN (NITROSTAT) 0.4 MG SL tablet Place 0.4 mg under the tongue.    . Omega-3 Fatty Acids (OMEGA 3 PO) Take 2 capsules by mouth daily.      Marland Kitchen omeprazole (PRILOSEC) 20 MG capsule TAKE 1 CAPSULE DAILY    . Potassium Chlorate GRAN by Does not apply route 2 (two) times daily.      . potassium chloride SA (K-DUR,KLOR-CON) 20 MEQ tablet Take by mouth.    .  potassium chloride SA (KLOR-CON M20) 20 MEQ tablet Take by mouth.    . Ranitidine HCl 150 MG PACK Take 150 mg by mouth 2 (two) times daily.      . solifenacin (VESICARE) 5 MG tablet Take 10 mg by mouth.    . solifenacin (VESICARE) 5 MG tablet Take 5 mg by  mouth.    . tamsulosin (FLOMAX) 0.4 MG CAPS capsule Take 0.4 mg by mouth.    . TAMSULOSIN HCL PO Take by mouth daily.      Marland Kitchen terazosin (HYTRIN) 2 MG capsule Take 2 mg by mouth.    . TERAZOSIN HCL PO Take 2 mg by mouth daily.      Marland Kitchen tiotropium (SPIRIVA HANDIHALER) 18 MCG inhalation capsule Place into inhaler and inhale.    . tiotropium (SPIRIVA) 18 MCG inhalation capsule Place into inhaler and inhale.    . tiotropium (SPIRIVA) 18 MCG inhalation capsule Place into inhaler and inhale.    . TRIAMTERENE PO Take by mouth daily.      Marland Kitchen triamterene-hydrochlorothiazide (MAXZIDE-25) 37.5-25 MG per tablet Take by mouth.     No current facility-administered medications on file prior to visit.    No Known Allergies  Objective: General: Patient is awake, alert, and oriented x 3 and in no acute distress.  Integument: Skin is warm, dry and supple bilateral. Nails are tender, long, thickened and dystrophic with subungual debris, consistent with onychomycosis, 1-5 bilateral. No signs of infection. No open lesions present bilateral. Mild reactive keratosis at left 3rdtoe distal tuft. Remaining integument unremarkable.  Vasculature:  Dorsalis Pedis pulse 1/4 bilateral. Posterior Tibial pulse  1/4 bilateral. Capillary fill time <3 sec 1-5 bilateral. Scant hair growth to the level of the digits.Temperature gradient within normal limits. No varicosities present bilateral. No edema present bilateral.   Neurology: The patient has diminished sensation measured with a 5.07/10g Semmes Weinstein Monofilament at all pedal sites bilateral. Vibratory sensation diminished bilateral with tuning fork. No Babinski sign present bilateral.   Musculoskeletal: Asymptomatic  bunion, hammertoes deformities and pes planus bilateral. Muscular strength 5/5 in all lower extremity muscular groups bilateral without pain or limitation on range of motion . No tenderness with calf compression bilateral.  Assessment and Plan: Problem List Items Addressed This Visit    None    Visit Diagnoses    Type 2 diabetes, controlled, with neuropathy ([place])    -  Primary   Pain due to onychomycosis of toenail       Bunion       Hammer toes of both feet       Pes planus of both feet       Corn of toe         -Examined patient. -Discussed and educated patient on diabetic foot care, especially with  regards to the vascular, neurological and musculoskeletal systems.  -Stressed the importance of good glycemic control and the detriment of not controlling glucose levels in relation to the foot. -ABN signed -Mechanically debrided keratosis at tip of left 3rd toe using rotary bur and all nails 1-5 bilateral using sterile nail nipper and filed with dremel without incident  -Continue with silicone toe cap, left 3rd toe as needed  -Recommend daily skin emollients.  -Cont with Diabetic shoes  -Patient to return as needed or in 2.5 to 6months for at risk foot care -Patient advised to call the office if any problems or questions arise in the meantime.  Landis Martins, DPM

## 2017-10-08 DIAGNOSIS — T783XXA Angioneurotic edema, initial encounter: Secondary | ICD-10-CM | POA: Insufficient documentation

## 2017-10-08 DIAGNOSIS — J189 Pneumonia, unspecified organism: Secondary | ICD-10-CM | POA: Insufficient documentation

## 2017-10-11 ENCOUNTER — Ambulatory Visit: Payer: Medicare Other | Admitting: Sports Medicine

## 2017-11-15 ENCOUNTER — Encounter: Payer: Self-pay | Admitting: Sports Medicine

## 2017-11-15 ENCOUNTER — Ambulatory Visit (INDEPENDENT_AMBULATORY_CARE_PROVIDER_SITE_OTHER): Payer: Medicare Other | Admitting: Sports Medicine

## 2017-11-15 VITALS — BP 144/84 | HR 62 | Temp 98.6°F | Resp 16

## 2017-11-15 DIAGNOSIS — E1140 Type 2 diabetes mellitus with diabetic neuropathy, unspecified: Secondary | ICD-10-CM

## 2017-11-15 DIAGNOSIS — M79672 Pain in left foot: Secondary | ICD-10-CM

## 2017-11-15 DIAGNOSIS — Q828 Other specified congenital malformations of skin: Secondary | ICD-10-CM

## 2017-11-15 DIAGNOSIS — B351 Tinea unguium: Secondary | ICD-10-CM

## 2017-11-15 DIAGNOSIS — M79676 Pain in unspecified toe(s): Secondary | ICD-10-CM

## 2017-11-15 DIAGNOSIS — M79671 Pain in right foot: Secondary | ICD-10-CM

## 2017-11-15 NOTE — Progress Notes (Signed)
Patient ID: Richard Weeks, male   DOB: [birth year], 84 y.o.   MRN: [id number]   Subjective: Richard Weeks is a 84 y.o. male patient with history of type 2 diabetes who presents to office today complaining of long, painful nails  while ambulating in shoes and callus; unable to trim. Patient states that the glucose reading this morning was 114 and can not recall PCP name right now but saw PCP a couple weeks ago. Admits since last visit he was in hospital had allergic reaction to blood pressure medication. Patient denies any other new problems since last visit.  Patient Active Problem List   Diagnosis Date Noted  . Obstructive apnea 11/03/2014  . Allergic rhinitis 09/30/2012  . Cardiac enlargement 09/30/2012  . Chronic obstructive pulmonary disease ([place]) 09/30/2012  . Diaphragmatic hernia 09/30/2012  . Diverticular disease of large intestine 09/30/2012  . Long term current use of aspirin 09/30/2012  . Acid reflux 09/30/2012  . Essential (primary) hypertension 09/30/2012  . Cardiac disease 09/30/2012  . Hemorrhoid 09/30/2012  . HLD (hyperlipidemia) 09/30/2012  . Heart & renal disease, hypertensive, with heart failure ([place]) 09/30/2012  . Decreased potassium in the blood 09/30/2012  . LBP (low back pain) 09/30/2012  . Lumbosacral spondylosis 09/30/2012  . Arthralgia of lower leg 09/30/2012  . Type 2 diabetes mellitus ([place]) 09/30/2012  . Combined fat and carbohydrate induced hyperlipemia 12/19/2011  . Benign hypertension 05/18/2011  . Bilateral cataracts 05/18/2011  . Diabetes mellitus ([place]) 05/18/2011  . Apnea, sleep 05/18/2011  . Pituitary adenoma ([place]) 05/01/2011  . Diabetes mellitus 10/13/2010  . Hypertension 10/13/2010  . Muscle pain 10/13/2010  . Bilateral swelling of feet 10/13/2010  . Fatigue 10/13/2010  . Chest pain 10/13/2010  . Memory loss 10/13/2010  . Bruises easily 10/13/2010  . History of bladder problems 10/13/2010   Current Outpatient Medications on File Prior to Visit   Medication Sig Dispense Refill  . albuterol (PROVENTIL HFA;VENTOLIN HFA) 108 (90 BASE) MCG/ACT inhaler Inhale into the lungs.    Marland Kitchen aspirin 325 MG tablet Take 325 mg by mouth.    . Aspirin-Caffeine 500-32.5 MG TABS Take by mouth.    Marland Kitchen atenolol (TENORMIN) 50 MG tablet Take 50 mg by mouth.    . ATENOLOL PO Take by mouth daily.      Marland Kitchen atorvastatin (LIPITOR) 10 MG tablet Take 10 mg by mouth.    . baclofen (LIORESAL) 10 MG tablet Take 10 mg by mouth 3 (three) times daily as needed. for muscle spams  0  . baclofen (LIORESAL) 10 MG tablet Take 10 mg by mouth.    . budesonide (PULMICORT) 0.25 MG/2ML nebulizer solution 0.25 mg.    . budesonide (RHINOCORT AQUA) 32 MCG/ACT nasal spray Frequency:PHARMDIR   Dosage:0.0     Instructions:  Note:2 Sprays / nostril QHS. Dose: 2 SPRAYS    . budesonide (RHINOCORT AQUA) 32 MCG/ACT nasal spray Spray 2 squirts each nostril at bedtime    . budesonide (RHINOCORT AQUA) 32 MCG/ACT nasal spray Spray 2 squirts each nostril at bedtime    . capsaicin (ZOSTRIX) 0.025 % cream Frequency:PHARMDIR   Dosage:0.0     Instructions:  Note:Apply to knees up toTID as needed for pain.`E1o3L` Wash hands after use. Dose: 0.025 %    . CLONIDINE HCL PO Take by mouth 2 (two) times daily.      Marland Kitchen diltiazem (CARDIZEM SR) 120 MG 12 hr capsule Take 120 mg by mouth 2 (two) times daily.  1  . diltiazem (DILACOR XR) 120  MG 24 hr capsule Take 120 mg by mouth.    Marland Kitchen DILTIAZEM HCL PO Take by mouth 2 (two) times daily.      . Dutasteride (AVODART PO) Take by mouth.      . dutasteride (AVODART) 0.5 MG capsule Take 0.5 mg by mouth daily.      . finasteride (PROSCAR) 5 MG tablet Take 5 mg by mouth.    . finasteride (PROSCAR) 5 MG tablet Take 5 mg by mouth daily.  25  . Fluticasone-Salmeterol (ADVAIR DISKUS) 250-50 MCG/DOSE AEPB Inhale into the lungs.    . Fluticasone-Salmeterol (ADVAIR HFA IN) Inhale into the lungs.      Marland Kitchen glimepiride (AMARYL) 1 MG tablet Take 1 mg by mouth.    Marland Kitchen GLIMEPIRIDE PO Take by  mouth daily.      . hydrALAZINE (APRESOLINE) 50 MG tablet Take 50 mg by mouth.    . hydrALAZINE (APRESOLINE) 50 MG tablet Increased to 3 pills a day    . HYDRALAZINE HCL PO Take 50 mg by mouth 2 (two) times daily.      Marland Kitchen HYDROcodone-acetaminophen (VICODIN) 5-500 MG per tablet Take by mouth.    Marland Kitchen HYDROcodone-homatropine (HYCODAN) 5-1.5 MG/5ML syrup   0  . IPRATROPIUM BROMIDE NA Place into the nose every 4 (four) hours.      . Ipratropium-Albuterol (ALBUTEROL-IPRATROPIUM IN) Inhale into the lungs every 4 (four) hours.      Marland Kitchen labetalol (NORMODYNE) 200 MG tablet Take 200 mg by mouth.    Marland Kitchen lisinopril (PRINIVIL,ZESTRIL) 10 MG tablet Take 10 mg by mouth daily.  2  . lisinopril (PRINIVIL,ZESTRIL) 10 MG tablet Take 10 mg by mouth.    . loratadine (CLARITIN) 10 MG tablet Take 10 mg by mouth.    . loratadine (CLARITIN) 10 MG tablet Take 10 mg by mouth.    Marland Kitchen LORazepam (ATIVAN) 0.5 MG tablet   2  . meclizine (ANTIVERT) 12.5 MG tablet Take 12.5 mg by mouth.    . meloxicam (MOBIC) 7.5 MG tablet Take 7.5 mg by mouth daily. with food  3  . METFORMIN HCL PO Take by mouth daily.      . mometasone (ASMANEX 120 METERED DOSES) 220 MCG/INH inhaler Frequency:PHARMDIR   Dosage:0.0     Instructions:  Note:Inhale 1 inhalation twice daily. Rinse mouth after using`E1o3L` 3 month supply Dose: ONE    . mometasone (ASMANEX) 220 MCG/INH inhaler Inhale 2 puffs into the lungs daily.      . mometasone (ASMANEX) 220 MCG/INH inhaler Inhale into the lungs.    . mometasone-formoterol (DULERA) 100-5 MCG/ACT AERO Inhale into the lungs.    . montelukast (SINGULAIR) 10 MG tablet Take 10 mg by mouth.    . Montelukast Sodium (SINGULAIR PO) Take by mouth daily.      . nitroGLYCERIN (NITROSTAT) 0.4 MG SL tablet Place 0.4 mg under the tongue.    . Omega-3 Fatty Acids (OMEGA 3 PO) Take 2 capsules by mouth daily.      Marland Kitchen omeprazole (PRILOSEC) 20 MG capsule TAKE 1 CAPSULE DAILY    . Potassium Chlorate GRAN by Does not apply route 2 (two)  times daily.      . potassium chloride SA (K-DUR,KLOR-CON) 20 MEQ tablet Take by mouth.    . potassium chloride SA (KLOR-CON M20) 20 MEQ tablet Take by mouth.    . Ranitidine HCl 150 MG PACK Take 150 mg by mouth 2 (two) times daily.      . solifenacin (VESICARE) 5 MG tablet Take  10 mg by mouth.    . solifenacin (VESICARE) 5 MG tablet Take 5 mg by mouth.    . tamsulosin (FLOMAX) 0.4 MG CAPS capsule Take 0.4 mg by mouth.    . TAMSULOSIN HCL PO Take by mouth daily.      Marland Kitchen terazosin (HYTRIN) 2 MG capsule Take 2 mg by mouth.    . TERAZOSIN HCL PO Take 2 mg by mouth daily.      Marland Kitchen tiotropium (SPIRIVA HANDIHALER) 18 MCG inhalation capsule Place into inhaler and inhale.    . tiotropium (SPIRIVA) 18 MCG inhalation capsule Place into inhaler and inhale.    . tiotropium (SPIRIVA) 18 MCG inhalation capsule Place into inhaler and inhale.    . TRIAMTERENE PO Take by mouth daily.      Marland Kitchen triamterene-hydrochlorothiazide (MAXZIDE-25) 37.5-25 MG per tablet Take by mouth.     No current facility-administered medications on file prior to visit.    No Known Allergies  Objective: General: Patient is awake, alert, and oriented x 3 and in no acute distress.  Integument: Skin is warm, dry and supple bilateral. Nails are tender, long, thickened and dystrophic with subungual debris, consistent with onychomycosis, 1-5 bilateral. No signs of infection. No open lesions present bilateral. Mild reactive keratosis at left 3rd toe distal tuft and dry skin. Remaining integument unremarkable.  Vasculature:  Dorsalis Pedis pulse 1/4 bilateral. Posterior Tibial pulse  1/4 bilateral. Capillary fill time <3 sec 1-5 bilateral. Scant hair growth to the level of the digits.Temperature gradient within normal limits. No varicosities present bilateral. No edema present bilateral.   Neurology: The patient has diminished sensation measured with a 5.07/10g Semmes Weinstein Monofilament at all pedal sites bilateral. Vibratory sensation  diminished bilateral with tuning fork. No Babinski sign present bilateral.   Musculoskeletal: Asymptomatic bunion, hammertoes deformities and pes planus bilateral. Muscular strength 5/5 in all lower extremity muscular groups bilateral without pain or limitation on range of motion . No tenderness with calf compression bilateral.  Assessment and Plan: Problem List Items Addressed This Visit    None    Visit Diagnoses    Pain due to onychomycosis of toenail    -  Primary   Type 2 diabetes, controlled, with neuropathy (HCC)       Porokeratosis       Foot pain, bilateral         -Examined patient. -Discussed and educated patient on diabetic foot care, especially with  regards to the vascular, neurological and musculoskeletal systems.  -Stressed the importance of good glycemic control and the detriment of not controlling glucose levels in relation to the foot. -Mechanically debrided keratosis at tip of left 3rd toe using rotary bur and all nails 1-5 bilateral using sterile nail nipper and filed with dremel without incident  -Recommend daily skin emollients and OTC lamisil spray for any itchiness between toes  -Patient to return as needed or in 2.5 to 24months for at risk foot care -Patient advised to call the office if any problems or questions arise in the meantime.  Landis Martins, DPM

## 2018-02-12 ENCOUNTER — Ambulatory Visit (INDEPENDENT_AMBULATORY_CARE_PROVIDER_SITE_OTHER): Payer: Medicare Other | Admitting: Podiatry

## 2018-02-12 ENCOUNTER — Encounter: Payer: Self-pay | Admitting: Podiatry

## 2018-02-12 VITALS — BP 152/80 | HR 86 | Resp 16

## 2018-02-12 DIAGNOSIS — E1151 Type 2 diabetes mellitus with diabetic peripheral angiopathy without gangrene: Secondary | ICD-10-CM

## 2018-02-12 DIAGNOSIS — B351 Tinea unguium: Secondary | ICD-10-CM

## 2018-02-12 DIAGNOSIS — E1142 Type 2 diabetes mellitus with diabetic polyneuropathy: Secondary | ICD-10-CM

## 2018-02-12 NOTE — Progress Notes (Signed)
Subjective:  Patient ID: Richard Weeks, male    DOB: 17-Aug-1932,  MRN: [id number]  Chief Complaint  Patient presents with  . debride    BL nail triming -FBS: 115 A1C: " IDK" PCP: "can't recall" x 1 wk    84 y.o. male presents  for diabetic foot care. Last AMBS was unknown. Denies numbness and tingling in their feet. Denies cramping in legs and thighs.  Review of Systems: Negative except as noted in the HPI. Denies N/V/F/Ch.  Past Medical History:  Diagnosis Date  . Bruises easily   . Diabetes mellitus   . Emphysema   . Fatigue   . High blood pressure   . History of bladder problems   . Memory loss   . Muscle cramps    pain  . Swelling of lower limb    Legs and feet    Current Outpatient Medications:  .  albuterol (PROVENTIL HFA;VENTOLIN HFA) 108 (90 BASE) MCG/ACT inhaler, Inhale into the lungs., Disp: , Rfl:  .  aspirin 325 MG tablet, Take 325 mg by mouth., Disp: , Rfl:  .  Aspirin-Caffeine 500-32.5 MG TABS, Take by mouth., Disp: , Rfl:  .  atenolol (TENORMIN) 50 MG tablet, Take 50 mg by mouth., Disp: , Rfl:  .  ATENOLOL PO, Take by mouth daily.  , Disp: , Rfl:  .  atorvastatin (LIPITOR) 10 MG tablet, Take 10 mg by mouth., Disp: , Rfl:  .  baclofen (LIORESAL) 10 MG tablet, Take 10 mg by mouth 3 (three) times daily as needed. for muscle spams, Disp: , Rfl: 0 .  baclofen (LIORESAL) 10 MG tablet, Take 10 mg by mouth., Disp: , Rfl:  .  budesonide (PULMICORT) 0.25 MG/2ML nebulizer solution, 0.25 mg., Disp: , Rfl:  .  budesonide (RHINOCORT AQUA) 32 MCG/ACT nasal spray, Frequency:PHARMDIR   Dosage:0.0     Instructions:  Note:2 Sprays / nostril QHS. Dose: 2 SPRAYS, Disp: , Rfl:  .  budesonide (RHINOCORT AQUA) 32 MCG/ACT nasal spray, Spray 2 squirts each nostril at bedtime, Disp: , Rfl:  .  budesonide (RHINOCORT AQUA) 32 MCG/ACT nasal spray, Spray 2 squirts each nostril at bedtime, Disp: , Rfl:  .  capsaicin (ZOSTRIX) 0.025 % cream, Frequency:PHARMDIR   Dosage:0.0     Instructions:   Note:Apply to knees up toTID as needed for pain.`E1o3L` Wash hands after use. Dose: 0.025 %, Disp: , Rfl:  .  CLONIDINE HCL PO, Take by mouth 2 (two) times daily.  , Disp: , Rfl:  .  diltiazem (CARDIZEM SR) 120 MG 12 hr capsule, Take 120 mg by mouth 2 (two) times daily., Disp: , Rfl: 1 .  diltiazem (DILACOR XR) 120 MG 24 hr capsule, Take 120 mg by mouth., Disp: , Rfl:  .  DILTIAZEM HCL PO, Take by mouth 2 (two) times daily.  , Disp: , Rfl:  .  Dutasteride (AVODART PO), Take by mouth.  , Disp: , Rfl:  .  dutasteride (AVODART) 0.5 MG capsule, Take 0.5 mg by mouth daily.  , Disp: , Rfl:  .  finasteride (PROSCAR) 5 MG tablet, Take 5 mg by mouth., Disp: , Rfl:  .  finasteride (PROSCAR) 5 MG tablet, Take 5 mg by mouth daily., Disp: , Rfl: 25 .  Fluticasone-Salmeterol (ADVAIR DISKUS) 250-50 MCG/DOSE AEPB, Inhale into the lungs., Disp: , Rfl:  .  Fluticasone-Salmeterol (ADVAIR HFA IN), Inhale into the lungs.  , Disp: , Rfl:  .  glimepiride (AMARYL) 1 MG tablet, Take 1 mg by mouth., Disp: ,  Rfl:  .  GLIMEPIRIDE PO, Take by mouth daily.  , Disp: , Rfl:  .  hydrALAZINE (APRESOLINE) 50 MG tablet, Take 50 mg by mouth., Disp: , Rfl:  .  hydrALAZINE (APRESOLINE) 50 MG tablet, Increased to 3 pills a day, Disp: , Rfl:  .  HYDRALAZINE HCL PO, Take 50 mg by mouth 2 (two) times daily.  , Disp: , Rfl:  .  HYDROcodone-acetaminophen (VICODIN) 5-500 MG per tablet, Take by mouth., Disp: , Rfl:  .  HYDROcodone-homatropine (HYCODAN) 5-1.5 MG/5ML syrup, , Disp: , Rfl: 0 .  IPRATROPIUM BROMIDE NA, Place into the nose every 4 (four) hours.  , Disp: , Rfl:  .  Ipratropium-Albuterol (ALBUTEROL-IPRATROPIUM IN), Inhale into the lungs every 4 (four) hours.  , Disp: , Rfl:  .  labetalol (NORMODYNE) 200 MG tablet, Take 200 mg by mouth., Disp: , Rfl:  .  lisinopril (PRINIVIL,ZESTRIL) 10 MG tablet, Take 10 mg by mouth daily., Disp: , Rfl: 2 .  lisinopril (PRINIVIL,ZESTRIL) 10 MG tablet, Take 10 mg by mouth., Disp: , Rfl:  .   loratadine (CLARITIN) 10 MG tablet, Take 10 mg by mouth., Disp: , Rfl:  .  loratadine (CLARITIN) 10 MG tablet, Take 10 mg by mouth., Disp: , Rfl:  .  LORazepam (ATIVAN) 0.5 MG tablet, , Disp: , Rfl: 2 .  meclizine (ANTIVERT) 12.5 MG tablet, Take 12.5 mg by mouth., Disp: , Rfl:  .  meloxicam (MOBIC) 7.5 MG tablet, Take 7.5 mg by mouth daily. with food, Disp: , Rfl: 3 .  METFORMIN HCL PO, Take by mouth daily.  , Disp: , Rfl:  .  mometasone (ASMANEX 120 METERED DOSES) 220 MCG/INH inhaler, Frequency:PHARMDIR   Dosage:0.0     Instructions:  Note:Inhale 1 inhalation twice daily. Rinse mouth after using`E1o3L` 3 month supply Dose: ONE, Disp: , Rfl:  .  mometasone (ASMANEX) 220 MCG/INH inhaler, Inhale 2 puffs into the lungs daily.  , Disp: , Rfl:  .  mometasone (ASMANEX) 220 MCG/INH inhaler, Inhale into the lungs., Disp: , Rfl:  .  mometasone-formoterol (DULERA) 100-5 MCG/ACT AERO, Inhale into the lungs., Disp: , Rfl:  .  montelukast (SINGULAIR) 10 MG tablet, Take 10 mg by mouth., Disp: , Rfl:  .  Montelukast Sodium (SINGULAIR PO), Take by mouth daily.  , Disp: , Rfl:  .  Omega-3 Fatty Acids (OMEGA 3 PO), Take 2 capsules by mouth daily.  , Disp: , Rfl:  .  omeprazole (PRILOSEC) 20 MG capsule, TAKE 1 CAPSULE DAILY, Disp: , Rfl:  .  pantoprazole (PROTONIX) 40 MG tablet, pantoprazole 40 mg tablet,delayed release  TAKE 1 TABLET BY MOUTH DAILY, Disp: , Rfl:  .  pantoprazole (PROTONIX) 40 MG tablet, Take 40 mg by mouth daily., Disp: , Rfl: 12 .  Potassium Chlorate GRAN, by Does not apply route 2 (two) times daily.  , Disp: , Rfl:  .  potassium chloride SA (K-DUR,KLOR-CON) 20 MEQ tablet, Take by mouth., Disp: , Rfl:  .  potassium chloride SA (KLOR-CON M20) 20 MEQ tablet, Take by mouth., Disp: , Rfl:  .  Ranitidine HCl 150 MG PACK, Take 150 mg by mouth 2 (two) times daily.  , Disp: , Rfl:  .  sildenafil (VIAGRA) 50 MG tablet, Take by mouth., Disp: , Rfl:  .  solifenacin (VESICARE) 5 MG tablet, Take 10 mg by  mouth., Disp: , Rfl:  .  solifenacin (VESICARE) 5 MG tablet, Take 5 mg by mouth., Disp: , Rfl:  .  sulfamethoxazole-trimethoprim (BACTRIM DS,SEPTRA  DS) 800-160 MG tablet, sulfamethoxazole 800 mg-trimethoprim 160 mg tablet, Disp: , Rfl:  .  tamsulosin (FLOMAX) 0.4 MG CAPS capsule, Take 0.4 mg by mouth., Disp: , Rfl:  .  TAMSULOSIN HCL PO, Take by mouth daily.  , Disp: , Rfl:  .  terazosin (HYTRIN) 2 MG capsule, Take 2 mg by mouth., Disp: , Rfl:  .  TERAZOSIN HCL PO, Take 2 mg by mouth daily.  , Disp: , Rfl:  .  tiotropium (SPIRIVA HANDIHALER) 18 MCG inhalation capsule, Place into inhaler and inhale., Disp: , Rfl:  .  tiotropium (SPIRIVA) 18 MCG inhalation capsule, Place into inhaler and inhale., Disp: , Rfl:  .  tiotropium (SPIRIVA) 18 MCG inhalation capsule, Place into inhaler and inhale., Disp: , Rfl:  .  TRIAMTERENE PO, Take by mouth daily.  , Disp: , Rfl:  .  triamterene-hydrochlorothiazide (MAXZIDE-25) 37.5-25 MG per tablet, Take by mouth., Disp: , Rfl:  .  nitroGLYCERIN (NITROSTAT) 0.4 MG SL tablet, Place 0.4 mg under the tongue., Disp: , Rfl:   Social History   Tobacco Use  Smoking Status Former Smoker  . Packs/day: 1.50  . Years: 16.00  . Pack years: 24.00  Smokeless Tobacco Never Used    Allergies  Allergen Reactions  . Lisinopril Anaphylaxis    angioedema    Objective:   Vitals:   02/12/18 1045  BP: (!) 152/80  Pulse: 86  Resp: 16   There is no height or weight on file to calculate BMI. Constitutional Well developed. Well nourished.  Vascular Dorsalis pedis pulses present 1+ bilaterally  Posterior tibial pulses 1+ bilaterally  Pedal hair growth diminished. Capillary refill normal to all digits.  No cyanosis or clubbing noted.  Neurologic Normal speech. Oriented to person, place, and time. Epicritic sensation to light touch grossly present bilaterally. Protective sensation with 5.07 monofilament  absent bilaterally.  Dermatologic Nails elongated, thickened,  dystrophic. No open wounds. No skin lesions.  Orthopedic: Normal joint ROM without pain or crepitus bilaterally. No visible deformities. No bony tenderness.   Assessment:   1. DM type 2 with diabetic peripheral neuropathy ([place])   2. Onychomycosis    Plan:  Patient was evaluated and treated and all questions answered.  Diabetes with DPN, Onychomycosis -Educated on diabetic footcare. Diabetic risk level 1 -Nails x10 debrided sharply and manually with large nail nipper and rotary burr.   Procedure: Nail Debridement Rationale: Patient meets criteria for routine foot care due to DPN Type of Debridement: manual, sharp debridement. Instrumentation: Nail nipper, rotary burr. Number of Nails: 10  Return in about 3 months (around 05/15/2018).

## 2018-02-14 ENCOUNTER — Ambulatory Visit: Payer: Medicare Other | Admitting: Sports Medicine

## 2018-05-15 ENCOUNTER — Ambulatory Visit: Payer: Medicare Other | Admitting: Sports Medicine

## 2018-05-23 ENCOUNTER — Ambulatory Visit (INDEPENDENT_AMBULATORY_CARE_PROVIDER_SITE_OTHER): Payer: Medicare Other | Admitting: Sports Medicine

## 2018-05-23 ENCOUNTER — Encounter: Payer: Self-pay | Admitting: Sports Medicine

## 2018-05-23 VITALS — BP 151/81 | HR 83 | Resp 16

## 2018-05-23 DIAGNOSIS — E1142 Type 2 diabetes mellitus with diabetic polyneuropathy: Secondary | ICD-10-CM

## 2018-05-23 DIAGNOSIS — M79676 Pain in unspecified toe(s): Secondary | ICD-10-CM

## 2018-05-23 DIAGNOSIS — Q828 Other specified congenital malformations of skin: Secondary | ICD-10-CM

## 2018-05-23 DIAGNOSIS — B351 Tinea unguium: Secondary | ICD-10-CM | POA: Diagnosis not present

## 2018-05-23 NOTE — Progress Notes (Signed)
Patient ID: Klein Willcox, male   DOB: 1932-11-01, 85 y.o.   MRN: [id number]   Subjective: Corderro Koloski is a 85 y.o. male patient with history of type 2 diabetes who presents to office today complaining of long, painful nails  while ambulating in shoes and callus; unable to trim. Patient states that the glucose reading this morning was 107 and does not recall last A1c or his last visit to his primary care. Patient denies any other new problems since last visit.  Patient Active Problem List   Diagnosis Date Noted  . Obstructive apnea 11/03/2014  . Allergic rhinitis 09/30/2012  . Cardiac enlargement 09/30/2012  . Chronic obstructive pulmonary disease ([place]) 09/30/2012  . Diaphragmatic hernia 09/30/2012  . Diverticular disease of large intestine 09/30/2012  . Long term current use of aspirin 09/30/2012  . Acid reflux 09/30/2012  . Essential (primary) hypertension 09/30/2012  . Cardiac disease 09/30/2012  . Hemorrhoid 09/30/2012  . HLD (hyperlipidemia) 09/30/2012  . Heart & renal disease, hypertensive, with heart failure ([place]) 09/30/2012  . Decreased potassium in the blood 09/30/2012  . LBP (low back pain) 09/30/2012  . Lumbosacral spondylosis 09/30/2012  . Arthralgia of lower leg 09/30/2012  . Type 2 diabetes mellitus ([place]) 09/30/2012  . Combined fat and carbohydrate induced hyperlipemia 12/19/2011  . Benign hypertension 05/18/2011  . Bilateral cataracts 05/18/2011  . Diabetes mellitus ([place]) 05/18/2011  . Apnea, sleep 05/18/2011  . Pituitary adenoma ([place]) 05/01/2011  . Diabetes mellitus 10/13/2010  . Hypertension 10/13/2010  . Muscle pain 10/13/2010  . Bilateral swelling of feet 10/13/2010  . Fatigue 10/13/2010  . Chest pain 10/13/2010  . Memory loss 10/13/2010  . Bruises easily 10/13/2010  . History of bladder problems 10/13/2010   Current Outpatient Medications on File Prior to Visit  Medication Sig Dispense Refill  . albuterol (PROVENTIL HFA;VENTOLIN HFA) 108 (90 BASE) MCG/ACT  inhaler Inhale into the lungs.    Marland Kitchen aspirin 325 MG tablet Take 325 mg by mouth.    . Aspirin-Caffeine 500-32.5 MG TABS Take by mouth.    Marland Kitchen atenolol (TENORMIN) 50 MG tablet Take 50 mg by mouth.    . ATENOLOL PO Take by mouth daily.      Marland Kitchen atorvastatin (LIPITOR) 10 MG tablet Take 10 mg by mouth.    . baclofen (LIORESAL) 10 MG tablet Take 10 mg by mouth 3 (three) times daily as needed. for muscle spams  0  . baclofen (LIORESAL) 10 MG tablet Take 10 mg by mouth.    . budesonide (PULMICORT) 0.25 MG/2ML nebulizer solution 0.25 mg.    . budesonide (RHINOCORT AQUA) 32 MCG/ACT nasal spray Frequency:PHARMDIR   Dosage:0.0     Instructions:  Note:2 Sprays / nostril QHS. Dose: 2 SPRAYS    . budesonide (RHINOCORT AQUA) 32 MCG/ACT nasal spray Spray 2 squirts each nostril at bedtime    . budesonide (RHINOCORT AQUA) 32 MCG/ACT nasal spray Spray 2 squirts each nostril at bedtime    . capsaicin (ZOSTRIX) 0.025 % cream Frequency:PHARMDIR   Dosage:0.0     Instructions:  Note:Apply to knees up toTID as needed for pain.`E1o3L` Wash hands after use. Dose: 0.025 %    . CLONIDINE HCL PO Take by mouth 2 (two) times daily.      Marland Kitchen diltiazem (CARDIZEM SR) 120 MG 12 hr capsule Take 120 mg by mouth 2 (two) times daily.  1  . diltiazem (DILACOR XR) 120 MG 24 hr capsule Take 120 mg by mouth.    Marland Kitchen DILTIAZEM HCL PO  Take by mouth 2 (two) times daily.      . Dutasteride (AVODART PO) Take by mouth.      . dutasteride (AVODART) 0.5 MG capsule Take 0.5 mg by mouth daily.      . finasteride (PROSCAR) 5 MG tablet Take 5 mg by mouth.    . finasteride (PROSCAR) 5 MG tablet Take 5 mg by mouth daily.  25  . Fluticasone-Salmeterol (ADVAIR DISKUS) 250-50 MCG/DOSE AEPB Inhale into the lungs.    . Fluticasone-Salmeterol (ADVAIR HFA IN) Inhale into the lungs.      Marland Kitchen glimepiride (AMARYL) 1 MG tablet Take 1 mg by mouth.    Marland Kitchen GLIMEPIRIDE PO Take by mouth daily.      . hydrALAZINE (APRESOLINE) 50 MG tablet Take 50 mg by mouth.    . hydrALAZINE  (APRESOLINE) 50 MG tablet Increased to 3 pills a day    . HYDRALAZINE HCL PO Take 50 mg by mouth 2 (two) times daily.      Marland Kitchen HYDROcodone-acetaminophen (VICODIN) 5-500 MG per tablet Take by mouth.    Marland Kitchen HYDROcodone-homatropine (HYCODAN) 5-1.5 MG/5ML syrup   0  . IPRATROPIUM BROMIDE NA Place into the nose every 4 (four) hours.      . Ipratropium-Albuterol (ALBUTEROL-IPRATROPIUM IN) Inhale into the lungs every 4 (four) hours.      Marland Kitchen labetalol (NORMODYNE) 200 MG tablet Take 200 mg by mouth.    Marland Kitchen lisinopril (PRINIVIL,ZESTRIL) 10 MG tablet Take 10 mg by mouth daily.  2  . lisinopril (PRINIVIL,ZESTRIL) 10 MG tablet Take 10 mg by mouth.    . loratadine (CLARITIN) 10 MG tablet Take 10 mg by mouth.    . loratadine (CLARITIN) 10 MG tablet Take 10 mg by mouth.    Marland Kitchen LORazepam (ATIVAN) 0.5 MG tablet   2  . meclizine (ANTIVERT) 12.5 MG tablet Take 12.5 mg by mouth.    . meloxicam (MOBIC) 7.5 MG tablet Take 7.5 mg by mouth daily. with food  3  . METFORMIN HCL PO Take by mouth daily.      . mometasone (ASMANEX 120 METERED DOSES) 220 MCG/INH inhaler Frequency:PHARMDIR   Dosage:0.0     Instructions:  Note:Inhale 1 inhalation twice daily. Rinse mouth after using`E1o3L` 3 month supply Dose: ONE    . mometasone (ASMANEX) 220 MCG/INH inhaler Inhale 2 puffs into the lungs daily.      . mometasone (ASMANEX) 220 MCG/INH inhaler Inhale into the lungs.    . mometasone-formoterol (DULERA) 100-5 MCG/ACT AERO Inhale into the lungs.    . montelukast (SINGULAIR) 10 MG tablet Take 10 mg by mouth.    . Montelukast Sodium (SINGULAIR PO) Take by mouth daily.      . nitroGLYCERIN (NITROSTAT) 0.4 MG SL tablet Place 0.4 mg under the tongue.    . Omega-3 Fatty Acids (OMEGA 3 PO) Take 2 capsules by mouth daily.      Marland Kitchen omeprazole (PRILOSEC) 20 MG capsule TAKE 1 CAPSULE DAILY    . pantoprazole (PROTONIX) 40 MG tablet pantoprazole 40 mg tablet,delayed release  TAKE 1 TABLET BY MOUTH DAILY    . pantoprazole (PROTONIX) 40 MG tablet Take  40 mg by mouth daily.  12  . Potassium Chlorate GRAN by Does not apply route 2 (two) times daily.      . potassium chloride SA (K-DUR,KLOR-CON) 20 MEQ tablet Take by mouth.    . potassium chloride SA (KLOR-CON M20) 20 MEQ tablet Take by mouth.    . Ranitidine HCl 150 MG PACK Take  150 mg by mouth 2 (two) times daily.      . sildenafil (VIAGRA) 50 MG tablet Take by mouth.    . solifenacin (VESICARE) 5 MG tablet Take 10 mg by mouth.    . solifenacin (VESICARE) 5 MG tablet Take 5 mg by mouth.    . sulfamethoxazole-trimethoprim (BACTRIM DS,SEPTRA DS) 800-160 MG tablet sulfamethoxazole 800 mg-trimethoprim 160 mg tablet    . tamsulosin (FLOMAX) 0.4 MG CAPS capsule Take 0.4 mg by mouth.    . TAMSULOSIN HCL PO Take by mouth daily.      Marland Kitchen terazosin (HYTRIN) 2 MG capsule Take 2 mg by mouth.    . TERAZOSIN HCL PO Take 2 mg by mouth daily.      Marland Kitchen tiotropium (SPIRIVA HANDIHALER) 18 MCG inhalation capsule Place into inhaler and inhale.    . tiotropium (SPIRIVA) 18 MCG inhalation capsule Place into inhaler and inhale.    . tiotropium (SPIRIVA) 18 MCG inhalation capsule Place into inhaler and inhale.    . TRIAMTERENE PO Take by mouth daily.      Marland Kitchen triamterene-hydrochlorothiazide (MAXZIDE-25) 37.5-25 MG per tablet Take by mouth.     No current facility-administered medications on file prior to visit.    Allergies  Allergen Reactions  . Lisinopril Anaphylaxis    angioedema     Objective: General: Patient is awake, alert, and oriented x 3 and in no acute distress.  Integument: Skin is warm, dry and supple bilateral. Nails are tender, long, thickened and dystrophic with subungual debris, consistent with onychomycosis, 1-5 bilateral. No signs of infection. No open lesions present bilateral. Mild reactive keratosis at left 3rd toe distal tuft and dry skin. Remaining integument unremarkable.  Vasculature:  Dorsalis Pedis pulse 1/4 bilateral. Posterior Tibial pulse  1/4 bilateral. Capillary fill time <3 sec  1-5 bilateral. Scant hair growth to the level of the digits.Temperature gradient within normal limits. No varicosities present bilateral. No edema present bilateral.   Neurology: The patient has diminished sensation measured with a 5.07/10g Semmes Weinstein Monofilament at all pedal sites bilateral. Vibratory sensation diminished bilateral with tuning fork. No Babinski sign present bilateral.   Musculoskeletal: Asymptomatic bunion, hammertoes deformities and pes planus bilateral. Muscular strength 5/5 in all lower extremity muscular groups bilateral without pain or limitation on range of motion . No tenderness with calf compression bilateral.  Assessment and Plan: Problem List Items Addressed This Visit    None    Visit Diagnoses    Pain due to onychomycosis of toenail    -  Primary   DM type 2 with diabetic peripheral neuropathy (HCC)       Porokeratosis         -Examined patient. -Discussed and educated patient on diabetic foot care, especially with  regards to the vascular, neurological and musculoskeletal systems.  -Mechanically debrided keratosis at tip of left 3rd toe using rotary bur and all nails 1-5 bilateral using sterile nail nipper and filed with dremel without incident  -Patient to return in 3 months for at risk foot care -Patient advised to call the office if any problems or questions arise in the meantime.  Landis Martins, DPM

## 2018-08-22 ENCOUNTER — Encounter: Payer: Self-pay | Admitting: Sports Medicine

## 2018-08-22 ENCOUNTER — Other Ambulatory Visit: Payer: Self-pay

## 2018-08-22 ENCOUNTER — Ambulatory Visit (INDEPENDENT_AMBULATORY_CARE_PROVIDER_SITE_OTHER): Payer: Medicare Other | Admitting: Sports Medicine

## 2018-08-22 VITALS — Temp 97.6°F

## 2018-08-22 DIAGNOSIS — M79676 Pain in unspecified toe(s): Secondary | ICD-10-CM | POA: Diagnosis not present

## 2018-08-22 DIAGNOSIS — E1142 Type 2 diabetes mellitus with diabetic polyneuropathy: Secondary | ICD-10-CM

## 2018-08-22 DIAGNOSIS — B351 Tinea unguium: Secondary | ICD-10-CM

## 2018-08-22 DIAGNOSIS — Q828 Other specified congenital malformations of skin: Secondary | ICD-10-CM

## 2018-08-22 NOTE — Progress Notes (Signed)
Patient ID: Richard Weeks, male   DOB: 07/01/1932, 85 y.o.   MRN: [id number]   Subjective: Richard Weeks is a 85 y.o. male patient with history of type 2 diabetes who presents to office today complaining of long, painful nails  while ambulating in shoes and callus; unable to trim. Patient states that the glucose reading this morning was not recorded and does not recall last A1c or his last visit to his primary care, maybe 2 to 3 weeks ago. Patient denies any other new problems since last visit.  Patient Active Problem List   Diagnosis Date Noted  . Obstructive apnea 11/03/2014  . Allergic rhinitis 09/30/2012  . Cardiac enlargement 09/30/2012  . Chronic obstructive pulmonary disease ([place]) 09/30/2012  . Diaphragmatic hernia 09/30/2012  . Diverticular disease of large intestine 09/30/2012  . Long term current use of aspirin 09/30/2012  . Acid reflux 09/30/2012  . Essential (primary) hypertension 09/30/2012  . Cardiac disease 09/30/2012  . Hemorrhoid 09/30/2012  . HLD (hyperlipidemia) 09/30/2012  . Heart & renal disease, hypertensive, with heart failure ([place]) 09/30/2012  . Decreased potassium in the blood 09/30/2012  . LBP (low back pain) 09/30/2012  . Lumbosacral spondylosis 09/30/2012  . Arthralgia of lower leg 09/30/2012  . Type 2 diabetes mellitus ([place]) 09/30/2012  . Combined fat and carbohydrate induced hyperlipemia 12/19/2011  . Benign hypertension 05/18/2011  . Bilateral cataracts 05/18/2011  . Diabetes mellitus ([place]) 05/18/2011  . Apnea, sleep 05/18/2011  . Pituitary adenoma ([place]) 05/01/2011  . Diabetes mellitus 10/13/2010  . Hypertension 10/13/2010  . Muscle pain 10/13/2010  . Bilateral swelling of feet 10/13/2010  . Fatigue 10/13/2010  . Chest pain 10/13/2010  . Memory loss 10/13/2010  . Bruises easily 10/13/2010  . History of bladder problems 10/13/2010   Current Outpatient Medications on File Prior to Visit  Medication Sig Dispense Refill  . albuterol (PROVENTIL  HFA;VENTOLIN HFA) 108 (90 BASE) MCG/ACT inhaler Inhale into the lungs.    Marland Kitchen aspirin 325 MG tablet Take 325 mg by mouth.    . Aspirin-Caffeine 500-32.5 MG TABS Take by mouth.    Marland Kitchen atenolol (TENORMIN) 50 MG tablet Take 50 mg by mouth.    . ATENOLOL PO Take by mouth daily.      Marland Kitchen atorvastatin (LIPITOR) 10 MG tablet Take 10 mg by mouth.    . baclofen (LIORESAL) 10 MG tablet Take 10 mg by mouth 3 (three) times daily as needed. for muscle spams  0  . baclofen (LIORESAL) 10 MG tablet Take 10 mg by mouth.    . budesonide (PULMICORT) 0.25 MG/2ML nebulizer solution 0.25 mg.    . budesonide (RHINOCORT AQUA) 32 MCG/ACT nasal spray Frequency:PHARMDIR   Dosage:0.0     Instructions:  Note:2 Sprays / nostril QHS. Dose: 2 SPRAYS    . budesonide (RHINOCORT AQUA) 32 MCG/ACT nasal spray Spray 2 squirts each nostril at bedtime    . budesonide (RHINOCORT AQUA) 32 MCG/ACT nasal spray Spray 2 squirts each nostril at bedtime    . capsaicin (ZOSTRIX) 0.025 % cream Frequency:PHARMDIR   Dosage:0.0     Instructions:  Note:Apply to knees up toTID as needed for pain.`E1o3L` Wash hands after use. Dose: 0.025 %    . CLONIDINE HCL PO Take by mouth 2 (two) times daily.      Marland Kitchen diltiazem (CARDIZEM SR) 120 MG 12 hr capsule Take 120 mg by mouth 2 (two) times daily.  1  . diltiazem (DILACOR XR) 120 MG 24 hr capsule Take 120 mg by mouth.    Marland Kitchen  DILTIAZEM HCL PO Take by mouth 2 (two) times daily.      . Dutasteride (AVODART PO) Take by mouth.      . dutasteride (AVODART) 0.5 MG capsule Take 0.5 mg by mouth daily.      . finasteride (PROSCAR) 5 MG tablet Take 5 mg by mouth.    . finasteride (PROSCAR) 5 MG tablet Take 5 mg by mouth daily.  25  . Fluticasone-Salmeterol (ADVAIR DISKUS) 250-50 MCG/DOSE AEPB Inhale into the lungs.    . Fluticasone-Salmeterol (ADVAIR HFA IN) Inhale into the lungs.      Marland Kitchen glimepiride (AMARYL) 1 MG tablet Take 1 mg by mouth.    Marland Kitchen GLIMEPIRIDE PO Take by mouth daily.      . hydrALAZINE (APRESOLINE) 50 MG tablet  Take 50 mg by mouth.    . hydrALAZINE (APRESOLINE) 50 MG tablet Increased to 3 pills a day    . HYDRALAZINE HCL PO Take 50 mg by mouth 2 (two) times daily.      Marland Kitchen HYDROcodone-acetaminophen (VICODIN) 5-500 MG per tablet Take by mouth.    Marland Kitchen HYDROcodone-homatropine (HYCODAN) 5-1.5 MG/5ML syrup   0  . IPRATROPIUM BROMIDE NA Place into the nose every 4 (four) hours.      . Ipratropium-Albuterol (ALBUTEROL-IPRATROPIUM IN) Inhale into the lungs every 4 (four) hours.      Marland Kitchen labetalol (NORMODYNE) 200 MG tablet Take 200 mg by mouth.    Marland Kitchen lisinopril (PRINIVIL,ZESTRIL) 10 MG tablet Take 10 mg by mouth daily.  2  . lisinopril (PRINIVIL,ZESTRIL) 10 MG tablet Take 10 mg by mouth.    . loratadine (CLARITIN) 10 MG tablet Take 10 mg by mouth.    . loratadine (CLARITIN) 10 MG tablet Take 10 mg by mouth.    Marland Kitchen LORazepam (ATIVAN) 0.5 MG tablet   2  . meclizine (ANTIVERT) 12.5 MG tablet Take 12.5 mg by mouth.    . meloxicam (MOBIC) 7.5 MG tablet Take 7.5 mg by mouth daily. with food  3  . METFORMIN HCL PO Take by mouth daily.      . mometasone (ASMANEX 120 METERED DOSES) 220 MCG/INH inhaler Frequency:PHARMDIR   Dosage:0.0     Instructions:  Note:Inhale 1 inhalation twice daily. Rinse mouth after using`E1o3L` 3 month supply Dose: ONE    . mometasone (ASMANEX) 220 MCG/INH inhaler Inhale 2 puffs into the lungs daily.      . mometasone (ASMANEX) 220 MCG/INH inhaler Inhale into the lungs.    . mometasone-formoterol (DULERA) 100-5 MCG/ACT AERO Inhale into the lungs.    . montelukast (SINGULAIR) 10 MG tablet Take 10 mg by mouth.    . Montelukast Sodium (SINGULAIR PO) Take by mouth daily.      . nitroGLYCERIN (NITROSTAT) 0.4 MG SL tablet Place 0.4 mg under the tongue.    . Omega-3 Fatty Acids (OMEGA 3 PO) Take 2 capsules by mouth daily.      Marland Kitchen omeprazole (PRILOSEC) 20 MG capsule TAKE 1 CAPSULE DAILY    . pantoprazole (PROTONIX) 40 MG tablet pantoprazole 40 mg tablet,delayed release  TAKE 1 TABLET BY MOUTH DAILY    .  pantoprazole (PROTONIX) 40 MG tablet Take 40 mg by mouth daily.  12  . Potassium Chlorate GRAN by Does not apply route 2 (two) times daily.      . potassium chloride SA (K-DUR,KLOR-CON) 20 MEQ tablet Take by mouth.    . potassium chloride SA (KLOR-CON M20) 20 MEQ tablet Take by mouth.    . Ranitidine HCl 150  MG PACK Take 150 mg by mouth 2 (two) times daily.      . sildenafil (VIAGRA) 50 MG tablet Take by mouth.    . solifenacin (VESICARE) 5 MG tablet Take 10 mg by mouth.    . solifenacin (VESICARE) 5 MG tablet Take 5 mg by mouth.    . sulfamethoxazole-trimethoprim (BACTRIM DS,SEPTRA DS) 800-160 MG tablet sulfamethoxazole 800 mg-trimethoprim 160 mg tablet    . tamsulosin (FLOMAX) 0.4 MG CAPS capsule Take 0.4 mg by mouth.    . TAMSULOSIN HCL PO Take by mouth daily.      Marland Kitchen terazosin (HYTRIN) 2 MG capsule Take 2 mg by mouth.    . TERAZOSIN HCL PO Take 2 mg by mouth daily.      Marland Kitchen tiotropium (SPIRIVA HANDIHALER) 18 MCG inhalation capsule Place into inhaler and inhale.    . tiotropium (SPIRIVA) 18 MCG inhalation capsule Place into inhaler and inhale.    . tiotropium (SPIRIVA) 18 MCG inhalation capsule Place into inhaler and inhale.    . TRIAMTERENE PO Take by mouth daily.      Marland Kitchen triamterene-hydrochlorothiazide (MAXZIDE-25) 37.5-25 MG per tablet Take by mouth.     No current facility-administered medications on file prior to visit.    Allergies  Allergen Reactions  . Lisinopril Anaphylaxis    angioedema     Objective: General: Patient is awake, alert, and oriented x 3 and in no acute distress.  Integument: Skin is warm, dry and supple bilateral. Nails are tender, long, thickened and dystrophic with subungual debris, consistent with onychomycosis, 1-5 bilateral. No signs of infection. No open lesions present bilateral. Mild reactive keratosis at left 3rd toe distal tuft and dry skin. Remaining integument unremarkable.  Vasculature:  Dorsalis Pedis pulse 1/4 bilateral. Posterior Tibial pulse   1/4 bilateral. Capillary fill time <3 sec 1-5 bilateral. Scant hair growth to the level of the digits.Temperature gradient within normal limits. No varicosities present bilateral. No edema present bilateral.   Neurology: The patient has diminished sensation measured with a 5.07/10g Semmes Weinstein Monofilament at all pedal sites bilateral. Vibratory sensation diminished bilateral with tuning fork. No Babinski sign present bilateral.   Musculoskeletal: Asymptomatic bunion, hammertoes deformities and pes planus bilateral. Muscular strength 5/5 in all lower extremity muscular groups bilateral without pain or limitation on range of motion . No tenderness with calf compression bilateral.  Assessment and Plan: Problem List Items Addressed This Visit    None    Visit Diagnoses    Pain due to onychomycosis of toenail    -  Primary   DM type 2 with diabetic peripheral neuropathy (HCC)       Porokeratosis         -Examined patient. -Discussed and educated patient on diabetic foot care, especially with  regards to the vascular, neurological and musculoskeletal systems.  -Mechanically debrided keratosis at tip of left 3rd toe using rotary bur and all nails 1-5 bilateral using sterile nail nipper and filed with dremel without incident  -Continue with good supportive shoes and daily foot inspection in the setting of diabetes -Patient to return in 3 months for at risk foot care -Patient advised to call the office if any problems or questions arise in the meantime.  Landis Martins, DPM

## 2018-11-21 ENCOUNTER — Encounter: Payer: Self-pay | Admitting: Sports Medicine

## 2018-11-21 ENCOUNTER — Other Ambulatory Visit: Payer: Self-pay

## 2018-11-21 ENCOUNTER — Ambulatory Visit (INDEPENDENT_AMBULATORY_CARE_PROVIDER_SITE_OTHER): Payer: Medicare Other | Admitting: Sports Medicine

## 2018-11-21 DIAGNOSIS — M79676 Pain in unspecified toe(s): Secondary | ICD-10-CM | POA: Diagnosis not present

## 2018-11-21 DIAGNOSIS — E1142 Type 2 diabetes mellitus with diabetic polyneuropathy: Secondary | ICD-10-CM | POA: Diagnosis not present

## 2018-11-21 DIAGNOSIS — B351 Tinea unguium: Secondary | ICD-10-CM | POA: Diagnosis not present

## 2018-11-21 NOTE — Progress Notes (Signed)
Patient ID: Richard Weeks, male   DOB: [birth year], 85 y.o.   MRN: [id number]   Subjective: Richard Weeks is a 85 y.o. male patient with history of type 2 diabetes who presents to office today complaining of long, painful nails  while ambulating in shoes and callus; unable to trim. Patient states that the glucose reading this morning was not recorded and does not recall last A1c.  Patient reports that he is waiting on getting his left knee replaced.  Patient denies any other new problems since last visit.  Patient Active Problem List   Diagnosis Date Noted  . Obstructive apnea 11/03/2014  . Allergic rhinitis 09/30/2012  . Cardiac enlargement 09/30/2012  . Chronic obstructive pulmonary disease ([place]) 09/30/2012  . Diaphragmatic hernia 09/30/2012  . Diverticular disease of large intestine 09/30/2012  . Long term current use of aspirin 09/30/2012  . Acid reflux 09/30/2012  . Essential (primary) hypertension 09/30/2012  . Cardiac disease 09/30/2012  . Hemorrhoid 09/30/2012  . HLD (hyperlipidemia) 09/30/2012  . Heart & renal disease, hypertensive, with heart failure ([place]) 09/30/2012  . Decreased potassium in the blood 09/30/2012  . LBP (low back pain) 09/30/2012  . Lumbosacral spondylosis 09/30/2012  . Arthralgia of lower leg 09/30/2012  . Type 2 diabetes mellitus ([place]) 09/30/2012  . Combined fat and carbohydrate induced hyperlipemia 12/19/2011  . Benign hypertension 05/18/2011  . Bilateral cataracts 05/18/2011  . Diabetes mellitus ([place]) 05/18/2011  . Apnea, sleep 05/18/2011  . Pituitary adenoma ([place]) 05/01/2011  . Diabetes mellitus 10/13/2010  . Hypertension 10/13/2010  . Muscle pain 10/13/2010  . Bilateral swelling of feet 10/13/2010  . Fatigue 10/13/2010  . Chest pain 10/13/2010  . Memory loss 10/13/2010  . Bruises easily 10/13/2010  . History of bladder problems 10/13/2010   Current Outpatient Medications on File Prior to Visit  Medication Sig Dispense Refill  . albuterol (PROVENTIL  HFA;VENTOLIN HFA) 108 (90 BASE) MCG/ACT inhaler Inhale into the lungs.    Marland Kitchen aspirin 325 MG tablet Take 325 mg by mouth.    . Aspirin-Caffeine 500-32.5 MG TABS Take by mouth.    Marland Kitchen atenolol (TENORMIN) 50 MG tablet Take 50 mg by mouth.    . ATENOLOL PO Take by mouth daily.      Marland Kitchen atorvastatin (LIPITOR) 10 MG tablet Take 10 mg by mouth.    . baclofen (LIORESAL) 10 MG tablet Take 10 mg by mouth 3 (three) times daily as needed. for muscle spams  0  . baclofen (LIORESAL) 10 MG tablet Take 10 mg by mouth.    . budesonide (PULMICORT) 0.25 MG/2ML nebulizer solution 0.25 mg.    . budesonide (RHINOCORT AQUA) 32 MCG/ACT nasal spray Frequency:PHARMDIR   Dosage:0.0     Instructions:  Note:2 Sprays / nostril QHS. Dose: 2 SPRAYS    . budesonide (RHINOCORT AQUA) 32 MCG/ACT nasal spray Spray 2 squirts each nostril at bedtime    . budesonide (RHINOCORT AQUA) 32 MCG/ACT nasal spray Spray 2 squirts each nostril at bedtime    . capsaicin (ZOSTRIX) 0.025 % cream Frequency:PHARMDIR   Dosage:0.0     Instructions:  Note:Apply to knees up toTID as needed for pain.`E1o3L` Wash hands after use. Dose: 0.025 %    . CLONIDINE HCL PO Take by mouth 2 (two) times daily.      Marland Kitchen diltiazem (CARDIZEM SR) 120 MG 12 hr capsule Take 120 mg by mouth 2 (two) times daily.  1  . diltiazem (DILACOR XR) 120 MG 24 hr capsule Take 120 mg by mouth.    Marland Kitchen  DILTIAZEM HCL PO Take by mouth 2 (two) times daily.      . Dutasteride (AVODART PO) Take by mouth.      . dutasteride (AVODART) 0.5 MG capsule Take 0.5 mg by mouth daily.      . finasteride (PROSCAR) 5 MG tablet Take 5 mg by mouth.    . finasteride (PROSCAR) 5 MG tablet Take 5 mg by mouth daily.  25  . Fluticasone-Salmeterol (ADVAIR DISKUS) 250-50 MCG/DOSE AEPB Inhale into the lungs.    . Fluticasone-Salmeterol (ADVAIR HFA IN) Inhale into the lungs.      Marland Kitchen glimepiride (AMARYL) 1 MG tablet Take 1 mg by mouth.    Marland Kitchen GLIMEPIRIDE PO Take by mouth daily.      . hydrALAZINE (APRESOLINE) 50 MG tablet  Take 50 mg by mouth.    . hydrALAZINE (APRESOLINE) 50 MG tablet Increased to 3 pills a day    . HYDRALAZINE HCL PO Take 50 mg by mouth 2 (two) times daily.      Marland Kitchen HYDROcodone-acetaminophen (VICODIN) 5-500 MG per tablet Take by mouth.    Marland Kitchen HYDROcodone-homatropine (HYCODAN) 5-1.5 MG/5ML syrup   0  . IPRATROPIUM BROMIDE NA Place into the nose every 4 (four) hours.      . Ipratropium-Albuterol (ALBUTEROL-IPRATROPIUM IN) Inhale into the lungs every 4 (four) hours.      Marland Kitchen labetalol (NORMODYNE) 200 MG tablet Take 200 mg by mouth.    Marland Kitchen lisinopril (PRINIVIL,ZESTRIL) 10 MG tablet Take 10 mg by mouth daily.  2  . lisinopril (PRINIVIL,ZESTRIL) 10 MG tablet Take 10 mg by mouth.    . loratadine (CLARITIN) 10 MG tablet Take 10 mg by mouth.    . loratadine (CLARITIN) 10 MG tablet Take 10 mg by mouth.    Marland Kitchen LORazepam (ATIVAN) 0.5 MG tablet   2  . meclizine (ANTIVERT) 12.5 MG tablet Take 12.5 mg by mouth.    . meloxicam (MOBIC) 7.5 MG tablet Take 7.5 mg by mouth daily. with food  3  . METFORMIN HCL PO Take by mouth daily.      . mometasone (ASMANEX 120 METERED DOSES) 220 MCG/INH inhaler Frequency:PHARMDIR   Dosage:0.0     Instructions:  Note:Inhale 1 inhalation twice daily. Rinse mouth after using`E1o3L` 3 month supply Dose: ONE    . mometasone (ASMANEX) 220 MCG/INH inhaler Inhale 2 puffs into the lungs daily.      . mometasone (ASMANEX) 220 MCG/INH inhaler Inhale into the lungs.    . mometasone-formoterol (DULERA) 100-5 MCG/ACT AERO Inhale into the lungs.    . montelukast (SINGULAIR) 10 MG tablet Take 10 mg by mouth.    . Montelukast Sodium (SINGULAIR PO) Take by mouth daily.      . nitroGLYCERIN (NITROSTAT) 0.4 MG SL tablet Place 0.4 mg under the tongue.    . Omega-3 Fatty Acids (OMEGA 3 PO) Take 2 capsules by mouth daily.      Marland Kitchen omeprazole (PRILOSEC) 20 MG capsule TAKE 1 CAPSULE DAILY    . pantoprazole (PROTONIX) 40 MG tablet pantoprazole 40 mg tablet,delayed release  TAKE 1 TABLET BY MOUTH DAILY    .  pantoprazole (PROTONIX) 40 MG tablet Take 40 mg by mouth daily.  12  . Potassium Chlorate GRAN by Does not apply route 2 (two) times daily.      . potassium chloride SA (K-DUR,KLOR-CON) 20 MEQ tablet Take by mouth.    . potassium chloride SA (KLOR-CON M20) 20 MEQ tablet Take by mouth.    . Ranitidine HCl 150  MG PACK Take 150 mg by mouth 2 (two) times daily.      . sildenafil (VIAGRA) 50 MG tablet Take by mouth.    . solifenacin (VESICARE) 5 MG tablet Take 10 mg by mouth.    . solifenacin (VESICARE) 5 MG tablet Take 5 mg by mouth.    . sulfamethoxazole-trimethoprim (BACTRIM DS,SEPTRA DS) 800-160 MG tablet sulfamethoxazole 800 mg-trimethoprim 160 mg tablet    . tamsulosin (FLOMAX) 0.4 MG CAPS capsule Take 0.4 mg by mouth.    . TAMSULOSIN HCL PO Take by mouth daily.      Marland Kitchen terazosin (HYTRIN) 2 MG capsule Take 2 mg by mouth.    . TERAZOSIN HCL PO Take 2 mg by mouth daily.      Marland Kitchen tiotropium (SPIRIVA HANDIHALER) 18 MCG inhalation capsule Place into inhaler and inhale.    . tiotropium (SPIRIVA) 18 MCG inhalation capsule Place into inhaler and inhale.    . tiotropium (SPIRIVA) 18 MCG inhalation capsule Place into inhaler and inhale.    . TRIAMTERENE PO Take by mouth daily.      Marland Kitchen triamterene-hydrochlorothiazide (MAXZIDE-25) 37.5-25 MG per tablet Take by mouth.     No current facility-administered medications on file prior to visit.    Allergies  Allergen Reactions  . Lisinopril Anaphylaxis    angioedema     Objective: General: Patient is awake, alert, and oriented x 3 and in no acute distress.  Integument: Skin is warm, dry and supple bilateral. Nails are tender, long, thickened and dystrophic with subungual debris, consistent with onychomycosis, 1-5 bilateral. No signs of infection. No open lesions present bilateral. Mild reactive keratosis at left 3rd toe distal tuft and dry skin. Remaining integument unremarkable.  Vasculature:  Dorsalis Pedis pulse 1/4 bilateral. Posterior Tibial pulse   1/4 bilateral. Capillary fill time <3 sec 1-5 bilateral. Scant hair growth to the level of the digits.Temperature gradient within normal limits. No varicosities present bilateral. No edema present bilateral.   Neurology: The patient has diminished sensation measured with a 5.07/10g Semmes Weinstein Monofilament at all pedal sites bilateral. Vibratory sensation diminished bilateral with tuning fork. No Babinski sign present bilateral.   Musculoskeletal: Asymptomatic bunion, hammertoes deformities and pes planus bilateral. Muscular strength 5/5 in all lower extremity muscular groups bilateral without pain or limitation on range of motion . No tenderness with calf compression bilateral.  Assessment and Plan: Problem List Items Addressed This Visit    None    Visit Diagnoses    Pain due to onychomycosis of toenail    -  Primary   DM type 2 with diabetic peripheral neuropathy ([place])         -Examined patient. -Discussed and educated patient on diabetic foot care, especially with  regards to the vascular, neurological and musculoskeletal systems.  -Mechanically debrided keratosis at tip of left 3rd toe using rotary bur and all nails 1-5 bilateral using sterile nail nipper and filed with dremel without incident  -Continue with good supportive shoes and daily foot inspection in the setting of diabetes -Advised daily skin emollients today applied foot miracle cream to patient's feet and advised him if he likes this may benefit from purchasing it from a -Patient to return in 3 months for at risk foot care -Patient advised to call the office if any problems or questions arise in the meantime.  Landis Martins, DPM

## 2019-02-19 ENCOUNTER — Ambulatory Visit: Payer: Medicare Other | Admitting: Sports Medicine

## 2019-03-07 ENCOUNTER — Ambulatory Visit: Payer: Medicare Other | Admitting: Sports Medicine

## 2019-03-13 ENCOUNTER — Ambulatory Visit (INDEPENDENT_AMBULATORY_CARE_PROVIDER_SITE_OTHER): Payer: Medicare Other | Admitting: Sports Medicine

## 2019-03-13 ENCOUNTER — Other Ambulatory Visit: Payer: Self-pay

## 2019-03-13 ENCOUNTER — Encounter: Payer: Self-pay | Admitting: Sports Medicine

## 2019-03-13 DIAGNOSIS — B351 Tinea unguium: Secondary | ICD-10-CM

## 2019-03-13 DIAGNOSIS — E1142 Type 2 diabetes mellitus with diabetic polyneuropathy: Secondary | ICD-10-CM

## 2019-03-13 DIAGNOSIS — Q828 Other specified congenital malformations of skin: Secondary | ICD-10-CM

## 2019-03-13 DIAGNOSIS — M79676 Pain in unspecified toe(s): Secondary | ICD-10-CM

## 2019-03-13 NOTE — Progress Notes (Signed)
Patient ID: Richard Weeks, male   DOB: 1932/11/30, 85 y.o.   MRN: [id number]   Subjective: Richard Weeks is a 85 y.o. male patient with history of type 2 diabetes who presents to office today complaining of long, painful nails  while ambulating in shoes and callus; unable to trim. Patient states that the glucose reading this morning was 107 and does not recall last A1c.  Last PCP visit was a couple of weeks ago. Patient reports that he's afraid to get his knee replaced. Patient denies any other new problems since last visit.  Patient Active Problem List   Diagnosis Date Noted  . Obstructive apnea 11/03/2014  . Allergic rhinitis 09/30/2012  . Cardiac enlargement 09/30/2012  . Chronic obstructive pulmonary disease ([place]) 09/30/2012  . Diaphragmatic hernia 09/30/2012  . Diverticular disease of large intestine 09/30/2012  . Long term current use of aspirin 09/30/2012  . Acid reflux 09/30/2012  . Essential (primary) hypertension 09/30/2012  . Cardiac disease 09/30/2012  . Hemorrhoid 09/30/2012  . HLD (hyperlipidemia) 09/30/2012  . Heart & renal disease, hypertensive, with heart failure ([place]) 09/30/2012  . Decreased potassium in the blood 09/30/2012  . LBP (low back pain) 09/30/2012  . Lumbosacral spondylosis 09/30/2012  . Arthralgia of lower leg 09/30/2012  . Type 2 diabetes mellitus ([place]) 09/30/2012  . Combined fat and carbohydrate induced hyperlipemia 12/19/2011  . Benign hypertension 05/18/2011  . Bilateral cataracts 05/18/2011  . Diabetes mellitus ([place]) 05/18/2011  . Apnea, sleep 05/18/2011  . Pituitary adenoma ([place]) 05/01/2011  . Diabetes mellitus 10/13/2010  . Hypertension 10/13/2010  . Muscle pain 10/13/2010  . Bilateral swelling of feet 10/13/2010  . Fatigue 10/13/2010  . Chest pain 10/13/2010  . Memory loss 10/13/2010  . Bruises easily 10/13/2010  . History of bladder problems 10/13/2010   Current Outpatient Medications on File Prior to Visit  Medication Sig Dispense Refill  .  albuterol (PROVENTIL HFA;VENTOLIN HFA) 108 (90 BASE) MCG/ACT inhaler Inhale into the lungs.    Marland Kitchen aspirin 325 MG tablet Take 325 mg by mouth.    . Aspirin-Caffeine 500-32.5 MG TABS Take by mouth.    Marland Kitchen atenolol (TENORMIN) 50 MG tablet Take 50 mg by mouth.    . ATENOLOL PO Take by mouth daily.      Marland Kitchen atorvastatin (LIPITOR) 10 MG tablet Take 10 mg by mouth.    . baclofen (LIORESAL) 10 MG tablet Take 10 mg by mouth 3 (three) times daily as needed. for muscle spams  0  . baclofen (LIORESAL) 10 MG tablet Take 10 mg by mouth.    . budesonide (PULMICORT) 0.25 MG/2ML nebulizer solution 0.25 mg.    . budesonide (RHINOCORT AQUA) 32 MCG/ACT nasal spray Frequency:PHARMDIR   Dosage:0.0     Instructions:  Note:2 Sprays / nostril QHS. Dose: 2 SPRAYS    . budesonide (RHINOCORT AQUA) 32 MCG/ACT nasal spray Spray 2 squirts each nostril at bedtime    . budesonide (RHINOCORT AQUA) 32 MCG/ACT nasal spray Spray 2 squirts each nostril at bedtime    . capsaicin (ZOSTRIX) 0.025 % cream Frequency:PHARMDIR   Dosage:0.0     Instructions:  Note:Apply to knees up toTID as needed for pain.`E1o3L` Wash hands after use. Dose: 0.025 %    . CLONIDINE HCL PO Take by mouth 2 (two) times daily.      Marland Kitchen diltiazem (CARDIZEM SR) 120 MG 12 hr capsule Take 120 mg by mouth 2 (two) times daily.  1  . diltiazem (DILACOR XR) 120 MG 24 hr capsule  Take 120 mg by mouth.    Marland Kitchen DILTIAZEM HCL PO Take by mouth 2 (two) times daily.      . Dutasteride (AVODART PO) Take by mouth.      . dutasteride (AVODART) 0.5 MG capsule Take 0.5 mg by mouth daily.      . finasteride (PROSCAR) 5 MG tablet Take 5 mg by mouth.    . finasteride (PROSCAR) 5 MG tablet Take 5 mg by mouth daily.  25  . Fluticasone-Salmeterol (ADVAIR DISKUS) 250-50 MCG/DOSE AEPB Inhale into the lungs.    . Fluticasone-Salmeterol (ADVAIR HFA IN) Inhale into the lungs.      Marland Kitchen glimepiride (AMARYL) 1 MG tablet Take 1 mg by mouth.    Marland Kitchen GLIMEPIRIDE PO Take by mouth daily.      . hydrALAZINE  (APRESOLINE) 50 MG tablet Take 50 mg by mouth.    . hydrALAZINE (APRESOLINE) 50 MG tablet Increased to 3 pills a day    . HYDRALAZINE HCL PO Take 50 mg by mouth 2 (two) times daily.      Marland Kitchen HYDROcodone-acetaminophen (VICODIN) 5-500 MG per tablet Take by mouth.    Marland Kitchen HYDROcodone-homatropine (HYCODAN) 5-1.5 MG/5ML syrup   0  . IPRATROPIUM BROMIDE NA Place into the nose every 4 (four) hours.      . Ipratropium-Albuterol (ALBUTEROL-IPRATROPIUM IN) Inhale into the lungs every 4 (four) hours.      Marland Kitchen labetalol (NORMODYNE) 200 MG tablet Take 200 mg by mouth.    Marland Kitchen lisinopril (PRINIVIL,ZESTRIL) 10 MG tablet Take 10 mg by mouth daily.  2  . lisinopril (PRINIVIL,ZESTRIL) 10 MG tablet Take 10 mg by mouth.    . loratadine (CLARITIN) 10 MG tablet Take 10 mg by mouth.    . loratadine (CLARITIN) 10 MG tablet Take 10 mg by mouth.    Marland Kitchen LORazepam (ATIVAN) 0.5 MG tablet   2  . meclizine (ANTIVERT) 12.5 MG tablet Take 12.5 mg by mouth.    . meloxicam (MOBIC) 7.5 MG tablet Take 7.5 mg by mouth daily. with food  3  . METFORMIN HCL PO Take by mouth daily.      . mometasone (ASMANEX 120 METERED DOSES) 220 MCG/INH inhaler Frequency:PHARMDIR   Dosage:0.0     Instructions:  Note:Inhale 1 inhalation twice daily. Rinse mouth after using`E1o3L` 3 month supply Dose: ONE    . mometasone (ASMANEX) 220 MCG/INH inhaler Inhale 2 puffs into the lungs daily.      . mometasone (ASMANEX) 220 MCG/INH inhaler Inhale into the lungs.    . mometasone-formoterol (DULERA) 100-5 MCG/ACT AERO Inhale into the lungs.    . montelukast (SINGULAIR) 10 MG tablet Take 10 mg by mouth.    . Montelukast Sodium (SINGULAIR PO) Take by mouth daily.      . nitroGLYCERIN (NITROSTAT) 0.4 MG SL tablet Place 0.4 mg under the tongue.    . Omega-3 Fatty Acids (OMEGA 3 PO) Take 2 capsules by mouth daily.      Marland Kitchen omeprazole (PRILOSEC) 20 MG capsule TAKE 1 CAPSULE DAILY    . pantoprazole (PROTONIX) 40 MG tablet pantoprazole 40 mg tablet,delayed release  TAKE 1  TABLET BY MOUTH DAILY    . pantoprazole (PROTONIX) 40 MG tablet Take 40 mg by mouth daily.  12  . Potassium Chlorate GRAN by Does not apply route 2 (two) times daily.      . potassium chloride SA (K-DUR,KLOR-CON) 20 MEQ tablet Take by mouth.    . potassium chloride SA (KLOR-CON M20) 20 MEQ tablet Take  by mouth.    . Ranitidine HCl 150 MG PACK Take 150 mg by mouth 2 (two) times daily.      . sildenafil (VIAGRA) 50 MG tablet Take by mouth.    . solifenacin (VESICARE) 5 MG tablet Take 10 mg by mouth.    . solifenacin (VESICARE) 5 MG tablet Take 5 mg by mouth.    . sulfamethoxazole-trimethoprim (BACTRIM DS,SEPTRA DS) 800-160 MG tablet sulfamethoxazole 800 mg-trimethoprim 160 mg tablet    . tamsulosin (FLOMAX) 0.4 MG CAPS capsule Take 0.4 mg by mouth.    . TAMSULOSIN HCL PO Take by mouth daily.      Marland Kitchen terazosin (HYTRIN) 2 MG capsule Take 2 mg by mouth.    . TERAZOSIN HCL PO Take 2 mg by mouth daily.      Marland Kitchen tiotropium (SPIRIVA HANDIHALER) 18 MCG inhalation capsule Place into inhaler and inhale.    . tiotropium (SPIRIVA) 18 MCG inhalation capsule Place into inhaler and inhale.    . tiotropium (SPIRIVA) 18 MCG inhalation capsule Place into inhaler and inhale.    . TRIAMTERENE PO Take by mouth daily.      Marland Kitchen triamterene-hydrochlorothiazide (MAXZIDE-25) 37.5-25 MG per tablet Take by mouth.     No current facility-administered medications on file prior to visit.    Allergies  Allergen Reactions  . Lisinopril Anaphylaxis    angioedema     Objective: General: Patient is awake, alert, and oriented x 3 and in no acute distress.  Integument: Skin is warm, dry and supple bilateral. Nails are tender, long, thickened and dystrophic with subungual debris, consistent with onychomycosis, 1-5 bilateral. No signs of infection. No open lesions present bilateral. Mild reactive keratosis at left 3rd toe distal tuft and dry skin. Remaining integument unremarkable.  Vasculature:  Dorsalis Pedis pulse 1/4  bilateral. Posterior Tibial pulse  1/4 bilateral. Capillary fill time <3 sec 1-5 bilateral. Scant hair growth to the level of the digits.Temperature gradient within normal limits. No varicosities present bilateral. No edema present bilateral.   Neurology: The patient has diminished sensation measured with a 5.07/10g Semmes Weinstein Monofilament at all pedal sites bilateral. Vibratory sensation diminished bilateral with tuning fork. No Babinski sign present bilateral.   Musculoskeletal: Asymptomatic bunion, hammertoes deformities and pes planus bilateral. Muscular strength 5/5 in all lower extremity muscular groups bilateral without pain or limitation on range of motion . No tenderness with calf compression bilateral.  Assessment and Plan: Problem List Items Addressed This Visit    None    Visit Diagnoses    Pain due to onychomycosis of toenail    -  Primary   DM type 2 with diabetic peripheral neuropathy ([facility])       Porokeratosis         -Examined patient. -Re-discussed and educated patient on diabetic foot care, especially with  regards to the vascular, neurological and musculoskeletal systems.  -Mechanically debrided keratosis at tip of left 3rd toe using rotary bur and all nails 1-5 bilateral using sterile nail nipper and filed with dremel without incident  -Applied foot miracle cream and advised patient to use sample daily  -Patient to return in 3 months for at risk foot care -Patient advised to call the office if any problems or questions arise in the meantime.  Landis Martins, DPM

## 2019-06-18 ENCOUNTER — Ambulatory Visit (INDEPENDENT_AMBULATORY_CARE_PROVIDER_SITE_OTHER): Payer: Medicare Other | Admitting: Sports Medicine

## 2019-06-18 ENCOUNTER — Encounter: Payer: Self-pay | Admitting: Sports Medicine

## 2019-06-18 ENCOUNTER — Ambulatory Visit: Payer: Medicare Other | Admitting: Sports Medicine

## 2019-06-18 ENCOUNTER — Other Ambulatory Visit: Payer: Self-pay

## 2019-06-18 DIAGNOSIS — M79676 Pain in unspecified toe(s): Secondary | ICD-10-CM | POA: Diagnosis not present

## 2019-06-18 DIAGNOSIS — B351 Tinea unguium: Secondary | ICD-10-CM

## 2019-06-18 DIAGNOSIS — E1142 Type 2 diabetes mellitus with diabetic polyneuropathy: Secondary | ICD-10-CM

## 2019-06-18 DIAGNOSIS — Q828 Other specified congenital malformations of skin: Secondary | ICD-10-CM

## 2019-06-18 NOTE — Progress Notes (Signed)
Patient ID: Richard Weeks, male   DOB: [birth year], 86 y.o.   MRN: [id number]   Subjective: Richard Weeks is a 86 y.o. male patient with history of type 2 diabetes who presents to office today complaining of long, painful nails  while ambulating in shoes and callus; unable to trim. Patient states that the glucose reading this morning was 112 and PCP visit was 2 weeks ago. Still awaiting on Knee surgery on left.   Patient Active Problem List   Diagnosis Date Noted  . Obstructive apnea 11/03/2014  . Allergic rhinitis 09/30/2012  . Cardiac enlargement 09/30/2012  . Chronic obstructive pulmonary disease ([place]) 09/30/2012  . Diaphragmatic hernia 09/30/2012  . Diverticular disease of large intestine 09/30/2012  . Long term current use of aspirin 09/30/2012  . Acid reflux 09/30/2012  . Essential (primary) hypertension 09/30/2012  . Cardiac disease 09/30/2012  . Hemorrhoid 09/30/2012  . HLD (hyperlipidemia) 09/30/2012  . Heart & renal disease, hypertensive, with heart failure ([place]) 09/30/2012  . Decreased potassium in the blood 09/30/2012  . LBP (low back pain) 09/30/2012  . Lumbosacral spondylosis 09/30/2012  . Arthralgia of lower leg 09/30/2012  . Type 2 diabetes mellitus ([place]) 09/30/2012  . Combined fat and carbohydrate induced hyperlipemia 12/19/2011  . Benign hypertension 05/18/2011  . Bilateral cataracts 05/18/2011  . Diabetes mellitus ([place]) 05/18/2011  . Apnea, sleep 05/18/2011  . Pituitary adenoma ([place]) 05/01/2011  . Diabetes mellitus 10/13/2010  . Hypertension 10/13/2010  . Muscle pain 10/13/2010  . Bilateral swelling of feet 10/13/2010  . Fatigue 10/13/2010  . Chest pain 10/13/2010  . Memory loss 10/13/2010  . Bruises easily 10/13/2010  . History of bladder problems 10/13/2010   Current Outpatient Medications on File Prior to Visit  Medication Sig Dispense Refill  . albuterol (PROVENTIL HFA;VENTOLIN HFA) 108 (90 BASE) MCG/ACT inhaler Inhale into the lungs.    Marland Kitchen aspirin 325 MG  tablet Take 325 mg by mouth.    . Aspirin-Caffeine 500-32.5 MG TABS Take by mouth.    Marland Kitchen atenolol (TENORMIN) 50 MG tablet Take 50 mg by mouth.    . ATENOLOL PO Take by mouth daily.      Marland Kitchen atorvastatin (LIPITOR) 10 MG tablet Take 10 mg by mouth.    . baclofen (LIORESAL) 10 MG tablet Take 10 mg by mouth 3 (three) times daily as needed. for muscle spams  0  . baclofen (LIORESAL) 10 MG tablet Take 10 mg by mouth.    . budesonide (PULMICORT) 0.25 MG/2ML nebulizer solution 0.25 mg.    . budesonide (RHINOCORT AQUA) 32 MCG/ACT nasal spray Frequency:PHARMDIR   Dosage:0.0     Instructions:  Note:2 Sprays / nostril QHS. Dose: 2 SPRAYS    . budesonide (RHINOCORT AQUA) 32 MCG/ACT nasal spray Spray 2 squirts each nostril at bedtime    . budesonide (RHINOCORT AQUA) 32 MCG/ACT nasal spray Spray 2 squirts each nostril at bedtime    . capsaicin (ZOSTRIX) 0.025 % cream Frequency:PHARMDIR   Dosage:0.0     Instructions:  Note:Apply to knees up toTID as needed for pain.`E1o3L` Wash hands after use. Dose: 0.025 %    . CLONIDINE HCL PO Take by mouth 2 (two) times daily.      Marland Kitchen diltiazem (CARDIZEM SR) 120 MG 12 hr capsule Take 120 mg by mouth 2 (two) times daily.  1  . diltiazem (DILACOR XR) 120 MG 24 hr capsule Take 120 mg by mouth.    Marland Kitchen DILTIAZEM HCL PO Take by mouth 2 (two) times daily.      Marland Kitchen  Dutasteride (AVODART PO) Take by mouth.      . dutasteride (AVODART) 0.5 MG capsule Take 0.5 mg by mouth daily.      . finasteride (PROSCAR) 5 MG tablet Take 5 mg by mouth.    . finasteride (PROSCAR) 5 MG tablet Take 5 mg by mouth daily.  25  . Fluticasone-Salmeterol (ADVAIR DISKUS) 250-50 MCG/DOSE AEPB Inhale into the lungs.    . Fluticasone-Salmeterol (ADVAIR HFA IN) Inhale into the lungs.      Marland Kitchen glimepiride (AMARYL) 1 MG tablet Take 1 mg by mouth.    Marland Kitchen GLIMEPIRIDE PO Take by mouth daily.      . hydrALAZINE (APRESOLINE) 50 MG tablet Take 50 mg by mouth.    . hydrALAZINE (APRESOLINE) 50 MG tablet Increased to 3 pills a day     . HYDRALAZINE HCL PO Take 50 mg by mouth 2 (two) times daily.      Marland Kitchen HYDROcodone-acetaminophen (VICODIN) 5-500 MG per tablet Take by mouth.    Marland Kitchen HYDROcodone-homatropine (HYCODAN) 5-1.5 MG/5ML syrup   0  . IPRATROPIUM BROMIDE NA Place into the nose every 4 (four) hours.      . Ipratropium-Albuterol (ALBUTEROL-IPRATROPIUM IN) Inhale into the lungs every 4 (four) hours.      Marland Kitchen labetalol (NORMODYNE) 200 MG tablet Take 200 mg by mouth.    Marland Kitchen lisinopril (PRINIVIL,ZESTRIL) 10 MG tablet Take 10 mg by mouth daily.  2  . lisinopril (PRINIVIL,ZESTRIL) 10 MG tablet Take 10 mg by mouth.    . loratadine (CLARITIN) 10 MG tablet Take 10 mg by mouth.    . loratadine (CLARITIN) 10 MG tablet Take 10 mg by mouth.    Marland Kitchen LORazepam (ATIVAN) 0.5 MG tablet   2  . meclizine (ANTIVERT) 12.5 MG tablet Take 12.5 mg by mouth.    . meloxicam (MOBIC) 7.5 MG tablet Take 7.5 mg by mouth daily. with food  3  . METFORMIN HCL PO Take by mouth daily.      . mometasone (ASMANEX 120 METERED DOSES) 220 MCG/INH inhaler Frequency:PHARMDIR   Dosage:0.0     Instructions:  Note:Inhale 1 inhalation twice daily. Rinse mouth after using`E1o3L` 3 month supply Dose: ONE    . mometasone (ASMANEX) 220 MCG/INH inhaler Inhale 2 puffs into the lungs daily.      . mometasone (ASMANEX) 220 MCG/INH inhaler Inhale into the lungs.    . mometasone-formoterol (DULERA) 100-5 MCG/ACT AERO Inhale into the lungs.    . montelukast (SINGULAIR) 10 MG tablet Take 10 mg by mouth.    . Montelukast Sodium (SINGULAIR PO) Take by mouth daily.      . nitroGLYCERIN (NITROSTAT) 0.4 MG SL tablet Place 0.4 mg under the tongue.    . Omega-3 Fatty Acids (OMEGA 3 PO) Take 2 capsules by mouth daily.      Marland Kitchen omeprazole (PRILOSEC) 20 MG capsule TAKE 1 CAPSULE DAILY    . pantoprazole (PROTONIX) 40 MG tablet pantoprazole 40 mg tablet,delayed release  TAKE 1 TABLET BY MOUTH DAILY    . pantoprazole (PROTONIX) 40 MG tablet Take 40 mg by mouth daily.  12  . Potassium Chlorate GRAN  by Does not apply route 2 (two) times daily.      . potassium chloride SA (K-DUR,KLOR-CON) 20 MEQ tablet Take by mouth.    . potassium chloride SA (KLOR-CON M20) 20 MEQ tablet Take by mouth.    . Ranitidine HCl 150 MG PACK Take 150 mg by mouth 2 (two) times daily.      Marland Kitchen  sildenafil (VIAGRA) 50 MG tablet Take by mouth.    . solifenacin (VESICARE) 5 MG tablet Take 10 mg by mouth.    . solifenacin (VESICARE) 5 MG tablet Take 5 mg by mouth.    . sulfamethoxazole-trimethoprim (BACTRIM DS,SEPTRA DS) 800-160 MG tablet sulfamethoxazole 800 mg-trimethoprim 160 mg tablet    . tamsulosin (FLOMAX) 0.4 MG CAPS capsule Take 0.4 mg by mouth.    . TAMSULOSIN HCL PO Take by mouth daily.      Marland Kitchen terazosin (HYTRIN) 2 MG capsule Take 2 mg by mouth.    . TERAZOSIN HCL PO Take 2 mg by mouth daily.      Marland Kitchen tiotropium (SPIRIVA HANDIHALER) 18 MCG inhalation capsule Place into inhaler and inhale.    . tiotropium (SPIRIVA) 18 MCG inhalation capsule Place into inhaler and inhale.    . tiotropium (SPIRIVA) 18 MCG inhalation capsule Place into inhaler and inhale.    . TRIAMTERENE PO Take by mouth daily.      Marland Kitchen triamterene-hydrochlorothiazide (MAXZIDE-25) 37.5-25 MG per tablet Take by mouth.     No current facility-administered medications on file prior to visit.   Allergies  Allergen Reactions  . Lisinopril Anaphylaxis    angioedema    Objective: General: Patient is awake, alert, and oriented x 3 and in no acute distress.  Integument: Skin is warm, dry and supple bilateral. Nails are tender, long, thickened and dystrophic with subungual debris, consistent with onychomycosis, 1-5 bilateral. No signs of infection. No open lesions present bilateral. Mild reactive keratosis at left 3rd toe distal tuft and dry skin. Remaining integument unremarkable.  Vasculature:  Dorsalis Pedis pulse 1/4 bilateral. Posterior Tibial pulse  1/4 bilateral. Capillary fill time <3 sec 1-5 bilateral. Scant hair growth to the level of the  digits.Temperature gradient within normal limits. No varicosities present bilateral. No edema present bilateral.   Neurology: The patient has diminished sensation measured with a 5.07/10g Semmes Weinstein Monofilament at all pedal sites bilateral. Vibratory sensation diminished bilateral with tuning fork. No Babinski sign present bilateral.   Musculoskeletal: Asymptomatic bunion, hammertoes deformities and pes planus bilateral. Muscular strength 5/5 in all lower extremity muscular groups bilateral without pain or limitation on range of motion. No tenderness with calf compression bilateral.  Assessment and Plan: Problem List Items Addressed This Visit    None    Visit Diagnoses    Pain due to onychomycosis of toenail    -  Primary   Porokeratosis       DM type 2 with diabetic peripheral neuropathy ([place])         -Examined patient. -Re-discussed and educated patient on diabetic foot care, especially with  regards to the vascular, neurological and musculoskeletal systems.  -Mechanically debrided keratosis at tip of left 3rd toe using rotary bur and all nails 1-5 bilateral using sterile nail nipper and filed with dremel without incident  -Applied foot miracle cream and advised patient to use sample daily again -Patient to return in 3 months for at risk foot care -Patient advised to call the office if any problems or questions arise in the meantime.  Landis Martins, DPM

## 2019-09-17 ENCOUNTER — Other Ambulatory Visit: Payer: Self-pay

## 2019-09-17 ENCOUNTER — Encounter: Payer: Self-pay | Admitting: Sports Medicine

## 2019-09-17 ENCOUNTER — Ambulatory Visit (INDEPENDENT_AMBULATORY_CARE_PROVIDER_SITE_OTHER): Payer: Medicare Other | Admitting: Sports Medicine

## 2019-09-17 DIAGNOSIS — B351 Tinea unguium: Secondary | ICD-10-CM

## 2019-09-17 DIAGNOSIS — E1142 Type 2 diabetes mellitus with diabetic polyneuropathy: Secondary | ICD-10-CM | POA: Diagnosis not present

## 2019-09-17 DIAGNOSIS — M79676 Pain in unspecified toe(s): Secondary | ICD-10-CM

## 2019-09-17 DIAGNOSIS — Q828 Other specified congenital malformations of skin: Secondary | ICD-10-CM

## 2019-09-17 NOTE — Progress Notes (Addendum)
Patient ID: Richard Weeks, male   DOB: 01/16/1933, 86 y.o.   MRN: [id number]   Subjective: Richard Weeks is a 86 y.o. male patient with history of type 2 diabetes who presents to office today complaining of long, painful nails  while ambulating in shoes and callus; unable to trim. Patient states that the glucose reading this morning was 100. Still awaiting on Knee surgery on left but has had this new problem with nerves in arms that has been giving him a problem.   Patient Active Problem List   Diagnosis Date Noted  . Obstructive apnea 11/03/2014  . Allergic rhinitis 09/30/2012  . Cardiac enlargement 09/30/2012  . Chronic obstructive pulmonary disease ([place]) 09/30/2012  . Diaphragmatic hernia 09/30/2012  . Diverticular disease of large intestine 09/30/2012  . Long term current use of aspirin 09/30/2012  . Acid reflux 09/30/2012  . Essential (primary) hypertension 09/30/2012  . Cardiac disease 09/30/2012  . Hemorrhoid 09/30/2012  . HLD (hyperlipidemia) 09/30/2012  . Heart & renal disease, hypertensive, with heart failure ([employer]) 09/30/2012  . Decreased potassium in the blood 09/30/2012  . LBP (low back pain) 09/30/2012  . Lumbosacral spondylosis 09/30/2012  . Arthralgia of lower leg 09/30/2012  . Type 2 diabetes mellitus ([place]) 09/30/2012  . Combined fat and carbohydrate induced hyperlipemia 12/19/2011  . Benign hypertension 05/18/2011  . Bilateral cataracts 05/18/2011  . Diabetes mellitus ([place]) 05/18/2011  . Apnea, sleep 05/18/2011  . Pituitary adenoma ([place]) 05/01/2011  . Diabetes mellitus 10/13/2010  . Hypertension 10/13/2010  . Muscle pain 10/13/2010  . Bilateral swelling of feet 10/13/2010  . Fatigue 10/13/2010  . Chest pain 10/13/2010  . Memory loss 10/13/2010  . Bruises easily 10/13/2010  . History of bladder problems 10/13/2010   Current Outpatient Medications on File Prior to Visit  Medication Sig Dispense Refill  . albuterol (PROVENTIL HFA;VENTOLIN HFA) 108 (90 BASE) MCG/ACT  inhaler Inhale into the lungs.    Marland Kitchen aspirin 325 MG tablet Take 325 mg by mouth.    . Aspirin-Caffeine 500-32.5 MG TABS Take by mouth.    Marland Kitchen atenolol (TENORMIN) 50 MG tablet Take 50 mg by mouth.    . ATENOLOL PO Take by mouth daily.      Marland Kitchen atorvastatin (LIPITOR) 10 MG tablet Take 10 mg by mouth.    . baclofen (LIORESAL) 10 MG tablet Take 10 mg by mouth 3 (three) times daily as needed. for muscle spams  0  . baclofen (LIORESAL) 10 MG tablet Take 10 mg by mouth.    . budesonide (PULMICORT) 0.25 MG/2ML nebulizer solution 0.25 mg.    . budesonide (RHINOCORT AQUA) 32 MCG/ACT nasal spray Frequency:PHARMDIR   Dosage:0.0     Instructions:  Note:2 Sprays / nostril QHS. Dose: 2 SPRAYS    . budesonide (RHINOCORT AQUA) 32 MCG/ACT nasal spray Spray 2 squirts each nostril at bedtime    . budesonide (RHINOCORT AQUA) 32 MCG/ACT nasal spray Spray 2 squirts each nostril at bedtime    . capsaicin (ZOSTRIX) 0.025 % cream Frequency:PHARMDIR   Dosage:0.0     Instructions:  Note:Apply to knees up toTID as needed for pain.`E1o3L` Wash hands after use. Dose: 0.025 %    . CLONIDINE HCL PO Take by mouth 2 (two) times daily.      Marland Kitchen diltiazem (CARDIZEM SR) 120 MG 12 hr capsule Take 120 mg by mouth 2 (two) times daily.  1  . diltiazem (DILACOR XR) 120 MG 24 hr capsule Take 120 mg by mouth.    Marland Kitchen DILTIAZEM  HCL PO Take by mouth 2 (two) times daily.      . Dutasteride (AVODART PO) Take by mouth.      . dutasteride (AVODART) 0.5 MG capsule Take 0.5 mg by mouth daily.      . finasteride (PROSCAR) 5 MG tablet Take 5 mg by mouth.    . finasteride (PROSCAR) 5 MG tablet Take 5 mg by mouth daily.  25  . Fluticasone-Salmeterol (ADVAIR DISKUS) 250-50 MCG/DOSE AEPB Inhale into the lungs.    . Fluticasone-Salmeterol (ADVAIR HFA IN) Inhale into the lungs.      Marland Kitchen glimepiride (AMARYL) 1 MG tablet Take 1 mg by mouth.    Marland Kitchen GLIMEPIRIDE PO Take by mouth daily.      . hydrALAZINE (APRESOLINE) 50 MG tablet Take 50 mg by mouth.    . hydrALAZINE  (APRESOLINE) 50 MG tablet Increased to 3 pills a day    . HYDRALAZINE HCL PO Take 50 mg by mouth 2 (two) times daily.      Marland Kitchen HYDROcodone-acetaminophen (VICODIN) 5-500 MG per tablet Take by mouth.    Marland Kitchen HYDROcodone-homatropine (HYCODAN) 5-1.5 MG/5ML syrup   0  . IPRATROPIUM BROMIDE NA Place into the nose every 4 (four) hours.      . Ipratropium-Albuterol (ALBUTEROL-IPRATROPIUM IN) Inhale into the lungs every 4 (four) hours.      Marland Kitchen labetalol (NORMODYNE) 200 MG tablet Take 200 mg by mouth.    Marland Kitchen lisinopril (PRINIVIL,ZESTRIL) 10 MG tablet Take 10 mg by mouth daily.  2  . lisinopril (PRINIVIL,ZESTRIL) 10 MG tablet Take 10 mg by mouth.    . loratadine (CLARITIN) 10 MG tablet Take 10 mg by mouth.    . loratadine (CLARITIN) 10 MG tablet Take 10 mg by mouth.    Marland Kitchen LORazepam (ATIVAN) 0.5 MG tablet   2  . meclizine (ANTIVERT) 12.5 MG tablet Take 12.5 mg by mouth.    . meloxicam (MOBIC) 7.5 MG tablet Take 7.5 mg by mouth daily. with food  3  . METFORMIN HCL PO Take by mouth daily.      . mometasone (ASMANEX 120 METERED DOSES) 220 MCG/INH inhaler Frequency:PHARMDIR   Dosage:0.0     Instructions:  Note:Inhale 1 inhalation twice daily. Rinse mouth after using`E1o3L` 3 month supply Dose: ONE    . mometasone (ASMANEX) 220 MCG/INH inhaler Inhale 2 puffs into the lungs daily.      . mometasone (ASMANEX) 220 MCG/INH inhaler Inhale into the lungs.    . mometasone (NASONEX) 50 MCG/ACT nasal spray 2 sprays daily.    . mometasone-formoterol (DULERA) 100-5 MCG/ACT AERO Inhale into the lungs.    . montelukast (SINGULAIR) 10 MG tablet Take 10 mg by mouth.    . Montelukast Sodium (SINGULAIR PO) Take by mouth daily.      . nitroGLYCERIN (NITROSTAT) 0.4 MG SL tablet Place 0.4 mg under the tongue.    . Omega-3 Fatty Acids (OMEGA 3 PO) Take 2 capsules by mouth daily.      Marland Kitchen omeprazole (PRILOSEC) 20 MG capsule TAKE 1 CAPSULE DAILY    . pantoprazole (PROTONIX) 40 MG tablet pantoprazole 40 mg tablet,delayed release  TAKE 1  TABLET BY MOUTH DAILY    . pantoprazole (PROTONIX) 40 MG tablet Take 40 mg by mouth daily.  12  . Potassium Chlorate GRAN by Does not apply route 2 (two) times daily.      . potassium chloride SA (K-DUR,KLOR-CON) 20 MEQ tablet Take by mouth.    . potassium chloride SA (KLOR-CON M20) 20  MEQ tablet Take by mouth.    . Ranitidine HCl 150 MG PACK Take 150 mg by mouth 2 (two) times daily.      . sildenafil (VIAGRA) 50 MG tablet Take by mouth.    . solifenacin (VESICARE) 5 MG tablet Take 10 mg by mouth.    . solifenacin (VESICARE) 5 MG tablet Take 5 mg by mouth.    . sulfamethoxazole-trimethoprim (BACTRIM DS,SEPTRA DS) 800-160 MG tablet sulfamethoxazole 800 mg-trimethoprim 160 mg tablet    . tamsulosin (FLOMAX) 0.4 MG CAPS capsule Take 0.4 mg by mouth.    . TAMSULOSIN HCL PO Take by mouth daily.      Marland Kitchen terazosin (HYTRIN) 2 MG capsule Take 2 mg by mouth.    . TERAZOSIN HCL PO Take 2 mg by mouth daily.      Marland Kitchen tiotropium (SPIRIVA HANDIHALER) 18 MCG inhalation capsule Place into inhaler and inhale.    . tiotropium (SPIRIVA) 18 MCG inhalation capsule Place into inhaler and inhale.    . tiotropium (SPIRIVA) 18 MCG inhalation capsule Place into inhaler and inhale.    . TRIAMTERENE PO Take by mouth daily.      Marland Kitchen triamterene-hydrochlorothiazide (MAXZIDE-25) 37.5-25 MG per tablet Take by mouth.     No current facility-administered medications on file prior to visit.   Allergies  Allergen Reactions  . Lisinopril Anaphylaxis    angioedema    Objective: General: Patient is awake, alert, and oriented x 3 and in no acute distress.  Integument: Skin is warm, dry and supple bilateral. Nails are tender, long, thickened and dystrophic with subungual debris, consistent with onychomycosis, 1-5 bilateral. No signs of infection. No open lesions present bilateral. Mild reactive keratosis at left 3rd toe distal tuft and dry skin. Remaining integument unremarkable.  Vasculature:  Dorsalis Pedis pulse 1/4 bilateral.  Posterior Tibial pulse  1/4 bilateral. Capillary fill time <3 sec 1-5 bilateral. Scant hair growth to the level of the digits.Temperature gradient within normal limits. No varicosities present bilateral. No edema present bilateral.   Neurology: The patient has diminished sensation measured with a 5.07/10g Semmes Weinstein Monofilament at all pedal sites bilateral. Vibratory sensation diminished bilateral with tuning fork. No Babinski sign present bilateral.   Musculoskeletal: Asymptomatic bunion, hammertoes deformities and pes planus bilateral. Muscular strength 5/5 in all lower extremity muscular groups bilateral without pain or limitation on range of motion. No tenderness with calf compression bilateral.  Assessment and Plan: Problem List Items Addressed This Visit    None    Visit Diagnoses    Pain due to onychomycosis of toenail    -  Primary   Porokeratosis       DM type 2 with diabetic peripheral neuropathy ([place])         -Examined patient. -Re-discussed and educated patient on diabetic foot care, especially with regards to the vascular, neurological and musculoskeletal systems.  -Mechanically debrided keratosis at tip of left 3rd toe using rotary bur and all nails 1-5 bilateral using sterile nail nipper and filed with dremel without incident  -Applied foot miracle cream and advised patient to use sample daily again and advised patient if this helps may purchase -Office to arrange measurement for diabetic shoes  -Continue with Neuro follow up regarding other issues  -Patient to return in 3 months for at risk foot care -Patient advised to call the office if any problems or questions arise in the meantime.  Landis Martins, DPM

## 2019-10-28 ENCOUNTER — Other Ambulatory Visit: Payer: Medicare Other | Admitting: Orthotics

## 2019-10-28 ENCOUNTER — Other Ambulatory Visit: Payer: Self-pay

## 2019-11-27 ENCOUNTER — Ambulatory Visit: Payer: Medicare Other | Admitting: Podiatry

## 2019-12-11 ENCOUNTER — Ambulatory Visit: Payer: Medicare Other | Admitting: Podiatry

## 2019-12-18 ENCOUNTER — Other Ambulatory Visit: Payer: Self-pay

## 2019-12-18 ENCOUNTER — Ambulatory Visit (INDEPENDENT_AMBULATORY_CARE_PROVIDER_SITE_OTHER): Payer: Medicare Other | Admitting: Podiatry

## 2019-12-18 DIAGNOSIS — B351 Tinea unguium: Secondary | ICD-10-CM

## 2019-12-18 DIAGNOSIS — E1142 Type 2 diabetes mellitus with diabetic polyneuropathy: Secondary | ICD-10-CM | POA: Diagnosis not present

## 2019-12-24 ENCOUNTER — Ambulatory Visit: Payer: Medicare Other | Admitting: Sports Medicine

## 2019-12-25 NOTE — Progress Notes (Signed)
  Subjective:  Patient ID: Richard Weeks, male    DOB: 1933/04/19,  MRN: [id number]  Chief Complaint  Patient presents with  . ebride    [facility]  . Diabetes    FBS: pt does not know A1C: na PCP; [place] x 2-3 wks    86 y.o. male presents with the above complaint. History confirmed with patient.   Objective:  Physical Exam: warm, good capillary refill, nail exam onychomycosis of the toenails, no trophic changes or ulcerative lesions. DP pulses palpable, PT pulses palpable and protective sensation absent Left Foot: normal exam, no swelling, tenderness, instability; ligaments intact, full range of motion of all ankle/foot joints  Right Foot: normal exam, no swelling, tenderness, instability; ligaments intact, full range of motion of all ankle/foot joints   No images are attached to the encounter.  Assessment:   1. Onychomycosis   2. DM type 2 with diabetic peripheral neuropathy ([place])      Plan:  Patient was evaluated and treated and all questions answered.  Onychomycosis, Diabetes and DPN -Patient is diabetic with a qualifying condition for at risk foot care. -DM shoes dispensed today  Procedure: Nail Debridement Rationale: Patient meets criteria for routine foot care due to DPN Type of Debridement: manual, sharp debridement. Instrumentation: Nail nipper, rotary burr. Number of Nails: 10    Return in about 3 months (around 03/19/2020) for Diabetic Foot Care.

## 2020-04-01 ENCOUNTER — Other Ambulatory Visit: Payer: Self-pay | Admitting: *Deleted

## 2020-04-01 ENCOUNTER — Ambulatory Visit (INDEPENDENT_AMBULATORY_CARE_PROVIDER_SITE_OTHER): Payer: Medicare Other | Admitting: Podiatry

## 2020-04-01 ENCOUNTER — Encounter: Payer: Self-pay | Admitting: Podiatry

## 2020-04-01 ENCOUNTER — Other Ambulatory Visit: Payer: Self-pay

## 2020-04-01 DIAGNOSIS — M2041 Other hammer toe(s) (acquired), right foot: Secondary | ICD-10-CM | POA: Diagnosis not present

## 2020-04-01 DIAGNOSIS — E1142 Type 2 diabetes mellitus with diabetic polyneuropathy: Secondary | ICD-10-CM | POA: Diagnosis not present

## 2020-04-01 DIAGNOSIS — B351 Tinea unguium: Secondary | ICD-10-CM

## 2020-04-01 DIAGNOSIS — E119 Type 2 diabetes mellitus without complications: Secondary | ICD-10-CM

## 2020-04-01 DIAGNOSIS — L84 Corns and callosities: Secondary | ICD-10-CM | POA: Diagnosis not present

## 2020-04-01 DIAGNOSIS — M2042 Other hammer toe(s) (acquired), left foot: Secondary | ICD-10-CM

## 2020-04-01 NOTE — Patient Instructions (Signed)
Apply Neosporin Cream to left 3rd digit once daily for one week. Call office if you have any problems.

## 2020-04-06 DIAGNOSIS — U071 COVID-19: Secondary | ICD-10-CM | POA: Insufficient documentation

## 2020-04-06 DIAGNOSIS — J1282 Pneumonia due to coronavirus disease 2019: Secondary | ICD-10-CM | POA: Insufficient documentation

## 2020-04-06 NOTE — Progress Notes (Signed)
ANNUAL DIABETIC FOOT EXAM  Subjective: Richard Weeks presents today for for annual diabetic foot examination and painful thick toenails that are difficult to trim. Pain interferes with ambulation. Aggravating factors include wearing enclosed shoe gear. Pain is relieved with periodic professional debridement..  Patient relates 10 year h/o diabetes.  Patient denies any h/o foot wounds.  Patient denies symptoms of foot numbness.  Patient denies symptoms of foot tingling.  Patient denies symptoms of burning in feet.  Patient's blood sugar was 114 mg/dl this morning.    Dr. Marina Gravel is patient's PCP. Last visit was two weeks ago.  Past Medical History:  Diagnosis Date  . Bruises easily   . Diabetes mellitus   . Emphysema   . Fatigue   . High blood pressure   . History of bladder problems   . Memory loss   . Muscle cramps    pain  . Swelling of lower limb    Legs and feet   Patient Active Problem List   Diagnosis Date Noted  . Obstructive apnea 11/03/2014  . Allergic rhinitis 09/30/2012  . Cardiac enlargement 09/30/2012  . Chronic obstructive pulmonary disease ([place]) 09/30/2012  . Diaphragmatic hernia 09/30/2012  . Diverticular disease of large intestine 09/30/2012  . Long term current use of aspirin 09/30/2012  . Acid reflux 09/30/2012  . Essential (primary) hypertension 09/30/2012  . Cardiac disease 09/30/2012  . Hemorrhoid 09/30/2012  . HLD (hyperlipidemia) 09/30/2012  . Heart & renal disease, hypertensive, with heart failure ([place]) 09/30/2012  . Decreased potassium in the blood 09/30/2012  . LBP (low back pain) 09/30/2012  . Lumbosacral spondylosis 09/30/2012  . Arthralgia of lower leg 09/30/2012  . Type 2 diabetes mellitus ([place]) 09/30/2012  . Combined fat and carbohydrate induced hyperlipemia 12/19/2011  . Benign hypertension 05/18/2011  . Bilateral cataracts 05/18/2011  . Diabetes mellitus ([place]) 05/18/2011  . Apnea, sleep 05/18/2011  . Pituitary adenoma ([place])  05/01/2011  . Diabetes mellitus 10/13/2010  . Hypertension 10/13/2010  . Muscle pain 10/13/2010  . Bilateral swelling of feet 10/13/2010  . Fatigue 10/13/2010  . Chest pain 10/13/2010  . Memory loss 10/13/2010  . Bruises easily 10/13/2010  . History of bladder problems 10/13/2010   History reviewed. No pertinent surgical history. Current Outpatient Medications on File Prior to Visit  Medication Sig Dispense Refill  . albuterol (PROVENTIL HFA;VENTOLIN HFA) 108 (90 BASE) MCG/ACT inhaler Inhale into the lungs.    Marland Kitchen aspirin 325 MG tablet Take 325 mg by mouth.    . Aspirin-Caffeine 500-32.5 MG TABS Take by mouth.    Marland Kitchen atenolol (TENORMIN) 50 MG tablet Take 50 mg by mouth.    . ATENOLOL PO Take by mouth daily.      Marland Kitchen atorvastatin (LIPITOR) 10 MG tablet Take 10 mg by mouth.    . baclofen (LIORESAL) 10 MG tablet Take 10 mg by mouth 3 (three) times daily as needed. for muscle spams  0  . baclofen (LIORESAL) 10 MG tablet Take 10 mg by mouth.    . budesonide (PULMICORT) 0.25 MG/2ML nebulizer solution 0.25 mg.    . budesonide (RHINOCORT AQUA) 32 MCG/ACT nasal spray Frequency:PHARMDIR   Dosage:0.0     Instructions:  Note:2 Sprays / nostril QHS. Dose: 2 SPRAYS    . budesonide (RHINOCORT AQUA) 32 MCG/ACT nasal spray Spray 2 squirts each nostril at bedtime    . budesonide (RHINOCORT AQUA) 32 MCG/ACT nasal spray Spray 2 squirts each nostril at bedtime    . capsaicin (ZOSTRIX) 0.025 % cream  Frequency:PHARMDIR   Dosage:0.0     Instructions:  Note:Apply to knees up toTID as needed for pain.`E1o3L` Wash hands after use. Dose: 0.025 %    . CLONIDINE HCL PO Take by mouth 2 (two) times daily.      Marland Kitchen diltiazem (CARDIZEM SR) 120 MG 12 hr capsule Take 120 mg by mouth 2 (two) times daily.  1  . diltiazem (DILACOR XR) 120 MG 24 hr capsule Take 120 mg by mouth.    Marland Kitchen DILTIAZEM HCL PO Take by mouth 2 (two) times daily.      . Dutasteride (AVODART PO) Take by mouth.      . dutasteride (AVODART) 0.5 MG capsule Take 0.5  mg by mouth daily.      . finasteride (PROSCAR) 5 MG tablet Take 5 mg by mouth.    . finasteride (PROSCAR) 5 MG tablet Take 5 mg by mouth daily.  25  . Fluticasone-Salmeterol (ADVAIR DISKUS) 250-50 MCG/DOSE AEPB Inhale into the lungs.    . Fluticasone-Salmeterol (ADVAIR HFA IN) Inhale into the lungs.      Marland Kitchen glimepiride (AMARYL) 1 MG tablet Take 1 mg by mouth.    Marland Kitchen GLIMEPIRIDE PO Take by mouth daily.      . hydrALAZINE (APRESOLINE) 50 MG tablet Take 50 mg by mouth.    . hydrALAZINE (APRESOLINE) 50 MG tablet Increased to 3 pills a day    . HYDRALAZINE HCL PO Take 50 mg by mouth 2 (two) times daily.      Marland Kitchen HYDROcodone-acetaminophen (VICODIN) 5-500 MG per tablet Take by mouth.    Marland Kitchen HYDROcodone-homatropine (HYCODAN) 5-1.5 MG/5ML syrup   0  . IPRATROPIUM BROMIDE NA Place into the nose every 4 (four) hours.      . Ipratropium-Albuterol (ALBUTEROL-IPRATROPIUM IN) Inhale into the lungs every 4 (four) hours.      Marland Kitchen labetalol (NORMODYNE) 200 MG tablet Take 200 mg by mouth.    Marland Kitchen lisinopril (PRINIVIL,ZESTRIL) 10 MG tablet Take 10 mg by mouth daily.  2  . lisinopril (PRINIVIL,ZESTRIL) 10 MG tablet Take 10 mg by mouth.    . loratadine (CLARITIN) 10 MG tablet Take 10 mg by mouth.    . loratadine (CLARITIN) 10 MG tablet Take 10 mg by mouth.    Marland Kitchen LORazepam (ATIVAN) 0.5 MG tablet   2  . meclizine (ANTIVERT) 12.5 MG tablet Take 12.5 mg by mouth.    . meloxicam (MOBIC) 7.5 MG tablet Take 7.5 mg by mouth daily. with food  3  . METFORMIN HCL PO Take by mouth daily.      . mometasone (ASMANEX 120 METERED DOSES) 220 MCG/INH inhaler Frequency:PHARMDIR   Dosage:0.0     Instructions:  Note:Inhale 1 inhalation twice daily. Rinse mouth after using`E1o3L` 3 month supply Dose: ONE    . mometasone (ASMANEX) 220 MCG/INH inhaler Inhale 2 puffs into the lungs daily.      . mometasone (ASMANEX) 220 MCG/INH inhaler Inhale into the lungs.    . mometasone (NASONEX) 50 MCG/ACT nasal spray 2 sprays daily.    .  mometasone-formoterol (DULERA) 100-5 MCG/ACT AERO Inhale into the lungs.    . montelukast (SINGULAIR) 10 MG tablet Take 10 mg by mouth.    . Montelukast Sodium (SINGULAIR PO) Take by mouth daily.      . nitroGLYCERIN (NITROSTAT) 0.4 MG SL tablet Place 0.4 mg under the tongue.    . Omega-3 Fatty Acids (OMEGA 3 PO) Take 2 capsules by mouth daily.      Marland Kitchen omeprazole (PRILOSEC)  20 MG capsule TAKE 1 CAPSULE DAILY    . pantoprazole (PROTONIX) 40 MG tablet pantoprazole 40 mg tablet,delayed release  TAKE 1 TABLET BY MOUTH DAILY    . pantoprazole (PROTONIX) 40 MG tablet Take 40 mg by mouth daily.  12  . Potassium Chlorate GRAN by Does not apply route 2 (two) times daily.      . potassium chloride SA (K-DUR,KLOR-CON) 20 MEQ tablet Take by mouth.    . potassium chloride SA (KLOR-CON M20) 20 MEQ tablet Take by mouth.    . Ranitidine HCl 150 MG PACK Take 150 mg by mouth 2 (two) times daily.      . sildenafil (VIAGRA) 50 MG tablet Take by mouth.    . solifenacin (VESICARE) 5 MG tablet Take 10 mg by mouth.    . solifenacin (VESICARE) 5 MG tablet Take 5 mg by mouth.    . sulfamethoxazole-trimethoprim (BACTRIM DS,SEPTRA DS) 800-160 MG tablet sulfamethoxazole 800 mg-trimethoprim 160 mg tablet    . tamsulosin (FLOMAX) 0.4 MG CAPS capsule Take 0.4 mg by mouth.    . TAMSULOSIN HCL PO Take by mouth daily.      Marland Kitchen terazosin (HYTRIN) 2 MG capsule Take 2 mg by mouth.    . TERAZOSIN HCL PO Take 2 mg by mouth daily.      Marland Kitchen tiotropium (SPIRIVA HANDIHALER) 18 MCG inhalation capsule Place into inhaler and inhale.    . tiotropium (SPIRIVA) 18 MCG inhalation capsule Place into inhaler and inhale.    . tiotropium (SPIRIVA) 18 MCG inhalation capsule Place into inhaler and inhale.    . TRIAMTERENE PO Take by mouth daily.      Marland Kitchen triamterene-hydrochlorothiazide (MAXZIDE-25) 37.5-25 MG per tablet Take by mouth.     No current facility-administered medications on file prior to visit.    Allergies  Allergen Reactions  .  Lisinopril Anaphylaxis    angioedema    Social History   Occupational History  . Not on file  Tobacco Use  . Smoking status: Former Smoker    Packs/day: 1.50    Years: 16.00    Pack years: 24.00  . Smokeless tobacco: Never Used  Substance and Sexual Activity  . Alcohol use: No    Comment: stopped 1972  . Drug use: No  . Sexual activity: Not Currently   Family History  Problem Relation Age of Onset  . Diabetes Mother   . Pancreatic cancer Mother   . Cancer Mother   . Diabetes Brother   . Hypertension Father    Immunization History  Administered Date(s) Administered  . Moderna Sars-Covid-2 Vaccination 06/17/2019, 07/15/2019     Review of Systems: Negative except as noted in the HPI.  Objective: There were no vitals filed for this visit.  Richard Weeks is a pleasant 86 y.o. African American male in NAD. AAO X 3.  Vascular Examination: Capillary refill time to digits immediate b/l. Palpable pedal pulses b/l LE. Pedal hair sparse. Lower extremity skin temperature gradient within normal limits. Trace edema noted left ankle.  Dermatological Examination: Pedal skin with normal turgor, texture and tone bilaterally. No open wounds bilaterally. No interdigital macerations bilaterally. Toenails 1-5 b/l elongated, discolored, dystrophic, thickened, crumbly with subungual debris and tenderness to dorsal palpation. Hyperkeratotic lesion(s) L 3rd toe.  No erythema, no edema, no drainage, no fluctuance.  Musculoskeletal Examination: Normal muscle strength 5/5 to all lower extremity muscle groups bilaterally. No pain crepitus or joint limitation noted with ROM b/l. Hammertoes noted to the L 3rd toe.  Footwear Assessment: Does the patient wear appropriate shoes? Yes. Does the patient need inserts/orthotics? Yes..  Neurological Examination: Protective sensation diminished with 10g monofilament b/l. Vibratory sensation decreased b/l.  Assessment: 1. Onychomycosis   2. Corns   3.  Acquired hammertoes of both feet   4. DM type 2 with diabetic peripheral neuropathy ([place])   5. Encounter for diabetic foot exam ([place])      ADA Risk Categorization: High Risk  Patient has one or more of the following: Loss of protective sensation Absent pedal pulses Severe Foot deformity History of foot ulcer  Plan: -Examined patient. -Discussed diabetic foot care principles. Literature dispensed on today. -Patient to continue soft, supportive shoe gear daily. -Toenails 1-5 b/l were debrided in length and girth with sterile nail nippers and dremel without iatrogenic bleeding.  -Corn(s) L 3rd toe pared utilizing sterile scalpel blade without complication or incident. Total number debrided=1. -Iatrogenic laceration sustained during L 3rd toe.  Treated with Lumicain Hemostatic Solution and alcohol. Patient instructed to apply Neosporin Cream to L 3rd toe once daily for 7 days. -Patient to report any pedal injuries to medical professional immediately. -Dispensed fabric toe crest for left 3rd digit. Apply every morning. Remove every evening. -Patient/POA to call should there be question/concern in the interim.  Return in about 3 months (around 06/30/2020) for diabetic toenails, corn(s)/callus(es).  Marzetta Board, DPM

## 2020-04-13 DIAGNOSIS — E1122 Type 2 diabetes mellitus with diabetic chronic kidney disease: Secondary | ICD-10-CM | POA: Insufficient documentation

## 2020-04-13 DIAGNOSIS — N182 Chronic kidney disease, stage 2 (mild): Secondary | ICD-10-CM | POA: Insufficient documentation

## 2020-07-01 ENCOUNTER — Other Ambulatory Visit: Payer: Self-pay

## 2020-07-01 ENCOUNTER — Ambulatory Visit (INDEPENDENT_AMBULATORY_CARE_PROVIDER_SITE_OTHER): Payer: Medicare Other | Admitting: Podiatry

## 2020-07-01 ENCOUNTER — Encounter: Payer: Self-pay | Admitting: Podiatry

## 2020-07-01 DIAGNOSIS — M2041 Other hammer toe(s) (acquired), right foot: Secondary | ICD-10-CM

## 2020-07-01 DIAGNOSIS — L84 Corns and callosities: Secondary | ICD-10-CM | POA: Diagnosis not present

## 2020-07-01 DIAGNOSIS — E1142 Type 2 diabetes mellitus with diabetic polyneuropathy: Secondary | ICD-10-CM

## 2020-07-01 DIAGNOSIS — M79676 Pain in unspecified toe(s): Secondary | ICD-10-CM | POA: Diagnosis not present

## 2020-07-01 DIAGNOSIS — N3281 Overactive bladder: Secondary | ICD-10-CM | POA: Insufficient documentation

## 2020-07-01 DIAGNOSIS — K4090 Unilateral inguinal hernia, without obstruction or gangrene, not specified as recurrent: Secondary | ICD-10-CM | POA: Insufficient documentation

## 2020-07-01 DIAGNOSIS — M2042 Other hammer toe(s) (acquired), left foot: Secondary | ICD-10-CM

## 2020-07-01 DIAGNOSIS — B351 Tinea unguium: Secondary | ICD-10-CM | POA: Diagnosis not present

## 2020-07-01 DIAGNOSIS — N529 Male erectile dysfunction, unspecified: Secondary | ICD-10-CM | POA: Insufficient documentation

## 2020-07-01 DIAGNOSIS — H269 Unspecified cataract: Secondary | ICD-10-CM | POA: Insufficient documentation

## 2020-07-01 DIAGNOSIS — N400 Enlarged prostate without lower urinary tract symptoms: Secondary | ICD-10-CM | POA: Insufficient documentation

## 2020-07-04 NOTE — Progress Notes (Signed)
Subjective: Richard Weeks presents today for at risk foot care with history of diabetic neuropathy and painful thick toenails that are difficult to trim. Pain interferes with ambulation. Aggravating factors include wearing enclosed shoe gear. Pain is relieved with periodic professional debridement.  Patient's blood sugar was 110 mg/dl this morning.   Dr. Marina Gravel is patient's PCP. Last visit was 3 months ago.  He states his left 3rd toe corn felt better after last visit, but has recently started bothering him again.  Past Medical History:  Diagnosis Date  . Bruises easily   . Diabetes mellitus   . Emphysema   . Fatigue   . High blood pressure   . History of bladder problems   . Memory loss   . Muscle cramps    pain  . Swelling of lower limb    Legs and feet   Patient Active Problem List   Diagnosis Date Noted  . Cataract 07/01/2020  . Erectile dysfunction 07/01/2020  . Benign prostatic hyperplasia 07/01/2020  . Inguinal hernia 07/01/2020  . Overactive bladder 07/01/2020  . CKD stage 2 due to type 2 diabetes mellitus ([place]) 04/13/2020  . COVID-19 04/06/2020  . Pneumonia due to COVID-19 virus 04/06/2020  . Angioedema 10/08/2017  . Right lower lobe pneumonia 10/08/2017  . Obstructive apnea 11/03/2014  . Allergic rhinitis 09/30/2012  . Cardiac enlargement 09/30/2012  . Chronic obstructive pulmonary disease ([place]) 09/30/2012  . Diaphragmatic hernia 09/30/2012  . Diverticular disease of large intestine 09/30/2012  . Long term current use of aspirin 09/30/2012  . Acid reflux 09/30/2012  . Essential (primary) hypertension 09/30/2012  . Cardiac disease 09/30/2012  . Hemorrhoid 09/30/2012  . HLD (hyperlipidemia) 09/30/2012  . Heart & renal disease, hypertensive, with heart failure ([place]) 09/30/2012  . Decreased potassium in the blood 09/30/2012  . LBP (low back pain) 09/30/2012  . Lumbosacral spondylosis 09/30/2012  . Arthralgia of lower leg 09/30/2012  . Type 2 diabetes mellitus  ([place]) 09/30/2012  . Combined fat and carbohydrate induced hyperlipemia 12/19/2011  . Hyperlipidemia due to type 2 diabetes mellitus ([place]) 12/19/2011  . Benign hypertension 05/18/2011  . Bilateral cataracts 05/18/2011  . Diabetes mellitus ([place]) 05/18/2011  . Apnea, sleep 05/18/2011  . Pituitary adenoma ([place]) 05/01/2011  . Diabetes mellitus 10/13/2010  . Hypertension 10/13/2010  . Muscle pain 10/13/2010  . Bilateral swelling of feet 10/13/2010  . Fatigue 10/13/2010  . Chest pain 10/13/2010  . Memory loss 10/13/2010  . Bruises easily 10/13/2010  . History of bladder problems 10/13/2010   History reviewed. No pertinent surgical history. Current Outpatient Medications on File Prior to Visit  Medication Sig Dispense Refill  . fluticasone (FLOVENT HFA) 220 MCG/ACT inhaler Inhale into the lungs.    Marland Kitchen albuterol (PROVENTIL HFA;VENTOLIN HFA) 108 (90 BASE) MCG/ACT inhaler Inhale into the lungs.    Marland Kitchen aspirin 325 MG tablet Take 325 mg by mouth.    . Aspirin-Caffeine 500-32.5 MG TABS Take by mouth.    Marland Kitchen atenolol (TENORMIN) 50 MG tablet Take 50 mg by mouth.    . ATENOLOL PO Take by mouth daily.      Marland Kitchen atorvastatin (LIPITOR) 10 MG tablet Take 10 mg by mouth.    . baclofen (LIORESAL) 10 MG tablet Take 10 mg by mouth 3 (three) times daily as needed. for muscle spams  0  . baclofen (LIORESAL) 10 MG tablet Take 10 mg by mouth.    . budesonide (PULMICORT) 0.25 MG/2ML nebulizer solution 0.25 mg.    . budesonide (RHINOCORT  AQUA) 32 MCG/ACT nasal spray Frequency:PHARMDIR   Dosage:0.0     Instructions:  Note:2 Sprays / nostril QHS. Dose: 2 SPRAYS    . budesonide (RHINOCORT AQUA) 32 MCG/ACT nasal spray Spray 2 squirts each nostril at bedtime    . budesonide (RHINOCORT AQUA) 32 MCG/ACT nasal spray Spray 2 squirts each nostril at bedtime    . capsaicin (ZOSTRIX) 0.025 % cream Frequency:PHARMDIR   Dosage:0.0     Instructions:  Note:Apply to knees up toTID as needed for pain.`E1o3L` Wash hands after use. Dose:  0.025 %    . CLONIDINE HCL PO Take by mouth 2 (two) times daily.      Marland Kitchen diltiazem (CARDIZEM SR) 120 MG 12 hr capsule Take 120 mg by mouth 2 (two) times daily.  1  . diltiazem (DILACOR XR) 120 MG 24 hr capsule Take 120 mg by mouth.    . diltiazem (TIAZAC) 120 MG 24 hr capsule Take by mouth.    Marland Kitchen DILTIAZEM HCL PO Take by mouth 2 (two) times daily.      . Dutasteride (AVODART PO) Take by mouth.      . dutasteride (AVODART) 0.5 MG capsule Take 0.5 mg by mouth daily.      . finasteride (PROSCAR) 5 MG tablet Take 5 mg by mouth.    . finasteride (PROSCAR) 5 MG tablet Take 5 mg by mouth daily.  25  . fluticasone (FLONASE) 50 MCG/ACT nasal spray Place 1 spray into both nostrils daily.    . Fluticasone-Salmeterol (ADVAIR DISKUS) 250-50 MCG/DOSE AEPB Inhale into the lungs.    . Fluticasone-Salmeterol (ADVAIR HFA IN) Inhale into the lungs.      Marland Kitchen glimepiride (AMARYL) 1 MG tablet Take 1 mg by mouth.    Marland Kitchen GLIMEPIRIDE PO Take by mouth daily.      . hydrALAZINE (APRESOLINE) 100 MG tablet Take 100 mg by mouth 3 (three) times daily.    . hydrALAZINE (APRESOLINE) 50 MG tablet Take 50 mg by mouth.    . hydrALAZINE (APRESOLINE) 50 MG tablet Increased to 3 pills a day    . HYDRALAZINE HCL PO Take 50 mg by mouth 2 (two) times daily.      Marland Kitchen HYDROcodone-acetaminophen (VICODIN) 5-500 MG per tablet Take by mouth.    Marland Kitchen HYDROcodone-homatropine (HYCODAN) 5-1.5 MG/5ML syrup   0  . ibuprofen (ADVIL) 800 MG tablet Take 800 mg by mouth every 6 (six) hours as needed.    . IPRATROPIUM BROMIDE NA Place into the nose every 4 (four) hours.      . Ipratropium-Albuterol (ALBUTEROL-IPRATROPIUM IN) Inhale into the lungs every 4 (four) hours.      Marland Kitchen labetalol (NORMODYNE) 200 MG tablet Take 200 mg by mouth.    . lactulose (CHRONULAC) 10 GM/15ML solution SMARTSIG:15 Milliliter(s) By Mouth 3 Times Daily PRN    . lisinopril (PRINIVIL,ZESTRIL) 10 MG tablet Take 10 mg by mouth daily.  2  . lisinopril (PRINIVIL,ZESTRIL) 10 MG tablet Take  10 mg by mouth.    . loratadine (CLARITIN) 10 MG tablet Take 10 mg by mouth.    . loratadine (CLARITIN) 10 MG tablet Take 10 mg by mouth.    Marland Kitchen LORazepam (ATIVAN) 0.5 MG tablet   2  . meclizine (ANTIVERT) 12.5 MG tablet Take 12.5 mg by mouth.    . meloxicam (MOBIC) 15 MG tablet Take 15 mg by mouth daily.    . meloxicam (MOBIC) 7.5 MG tablet Take 7.5 mg by mouth daily. with food  3  . METFORMIN  HCL PO Take by mouth daily.      . mometasone (ASMANEX 120 METERED DOSES) 220 MCG/INH inhaler Frequency:PHARMDIR   Dosage:0.0     Instructions:  Note:Inhale 1 inhalation twice daily. Rinse mouth after using`E1o3L` 3 month supply Dose: ONE    . mometasone (ASMANEX) 220 MCG/INH inhaler Inhale 2 puffs into the lungs daily.      . mometasone (ASMANEX) 220 MCG/INH inhaler Inhale into the lungs.    . mometasone (NASONEX) 50 MCG/ACT nasal spray 2 sprays daily.    . mometasone-formoterol (DULERA) 100-5 MCG/ACT AERO Inhale into the lungs.    . montelukast (SINGULAIR) 10 MG tablet Take 10 mg by mouth.    . Montelukast Sodium (SINGULAIR PO) Take by mouth daily.      . nitroGLYCERIN (NITROSTAT) 0.4 MG SL tablet Place 0.4 mg under the tongue.    . Omega-3 Fatty Acids (OMEGA 3 PO) Take 2 capsules by mouth daily.      Marland Kitchen omeprazole (PRILOSEC) 20 MG capsule TAKE 1 CAPSULE DAILY    . pantoprazole (PROTONIX) 40 MG tablet pantoprazole 40 mg tablet,delayed release  TAKE 1 TABLET BY MOUTH DAILY    . pantoprazole (PROTONIX) 40 MG tablet Take 40 mg by mouth daily.  12  . Potassium Chlorate GRAN by Does not apply route 2 (two) times daily.      . potassium chloride (KLOR-CON) 10 MEQ tablet Take 10 mEq by mouth daily.    . potassium chloride SA (K-DUR,KLOR-CON) 20 MEQ tablet Take by mouth.    . potassium chloride SA (KLOR-CON M20) 20 MEQ tablet Take by mouth.    . Ranitidine HCl 150 MG PACK Take 150 mg by mouth 2 (two) times daily.      . sildenafil (VIAGRA) 50 MG tablet Take by mouth.    . solifenacin (VESICARE) 5 MG tablet  Take 10 mg by mouth.    . solifenacin (VESICARE) 5 MG tablet Take 5 mg by mouth.    . sulfamethoxazole-trimethoprim (BACTRIM DS,SEPTRA DS) 800-160 MG tablet sulfamethoxazole 800 mg-trimethoprim 160 mg tablet    . tamsulosin (FLOMAX) 0.4 MG CAPS capsule Take 0.4 mg by mouth.    . TAMSULOSIN HCL PO Take by mouth daily.      Marland Kitchen terazosin (HYTRIN) 2 MG capsule Take 2 mg by mouth.    . TERAZOSIN HCL PO Take 2 mg by mouth daily.      Marland Kitchen tiotropium (SPIRIVA HANDIHALER) 18 MCG inhalation capsule Place into inhaler and inhale.    . tiotropium (SPIRIVA) 18 MCG inhalation capsule Place into inhaler and inhale.    . tiotropium (SPIRIVA) 18 MCG inhalation capsule Place into inhaler and inhale.    . traZODone (DESYREL) 50 MG tablet trazodone 50 mg tablet    . TRIAMTERENE PO Take by mouth daily.      Marland Kitchen triamterene-hydrochlorothiazide (MAXZIDE-25) 37.5-25 MG per tablet Take by mouth.    . zinc gluconate 50 MG tablet Take by mouth.     No current facility-administered medications on file prior to visit.    Allergies  Allergen Reactions  . Lisinopril Anaphylaxis    angioedema    Social History   Occupational History  . Not on file  Tobacco Use  . Smoking status: Former Smoker    Packs/day: 1.50    Years: 16.00    Pack years: 24.00  . Smokeless tobacco: Never Used  Substance and Sexual Activity  . Alcohol use: No    Comment: stopped 1972  . Drug use: No  .  Sexual activity: Not Currently   Family History  Problem Relation Age of Onset  . Diabetes Mother   . Pancreatic cancer Mother   . Cancer Mother   . Diabetes Brother   . Hypertension Father    Immunization History  Administered Date(s) Administered  . Moderna Sars-Covid-2 Vaccination 06/17/2019, 07/15/2019     Review of Systems: Negative except as noted in the HPI.  Objective: There were no vitals filed for this visit.  Richard Weeks is a pleasant 87 y.o. African American male in NAD. AAO X 3.  Vascular Examination: Capillary  refill time to digits immediate b/l. Palpable pedal pulses b/l LE. Pedal hair sparse. Lower extremity skin temperature gradient within normal limits. Trace edema noted left ankle.  Dermatological Examination: Pedal skin with normal turgor, texture and tone bilaterally. No open wounds bilaterally. No interdigital macerations bilaterally. Toenails 1-5 b/l elongated, discolored, dystrophic, thickened, crumbly with subungual debris and tenderness to dorsal palpation. Hyperkeratotic lesion(s) L 3rd toe.  No erythema, no edema, no drainage, no fluctuance.  Musculoskeletal Examination: Normal muscle strength 5/5 to all lower extremity muscle groups bilaterally. No pain crepitus or joint limitation noted with ROM b/l. Hammertoes noted to the L 3rd toe.  Neurological Examination: Protective sensation diminished with 10g monofilament b/l. Vibratory sensation decreased b/l.  Assessment: 1. Pain due to onychomycosis of toenail   2. Corns   3. Acquired hammertoes of both feet   4. DM type 2 with diabetic peripheral neuropathy ([place])     Plan: -Examined patient. -No new findings. No new orders. -Patient to continue soft, supportive shoe gear daily. -Toenails 1-5 b/l were debrided in length and girth with sterile nail nippers and dremel without iatrogenic bleeding.  -Corn(s) L 3rd toe pared utilizing sterile scalpel blade without complication or incident. Total number debrided=1. Dispensed toe cap for daily protection. -Patient to report any pedal injuries to medical professional immediately. -Dispensed fabric toe crest for left 3rd digit. Apply every morning. Remove every evening. -Patient/POA to call should there be question/concern in the interim.  Return in about 3 months (around 10/01/2020).  Marzetta Board, DPM

## 2020-07-30 ENCOUNTER — Other Ambulatory Visit: Payer: Self-pay | Admitting: Otolaryngology

## 2020-07-30 DIAGNOSIS — H918X9 Other specified hearing loss, unspecified ear: Secondary | ICD-10-CM

## 2020-07-30 DIAGNOSIS — R42 Dizziness and giddiness: Secondary | ICD-10-CM

## 2020-08-16 ENCOUNTER — Ambulatory Visit
Admission: RE | Admit: 2020-08-16 | Discharge: 2020-08-16 | Disposition: A | Discharge status: Home / Self Care | Payer: Medicare Other | Source: Ambulatory Visit | Attending: Otolaryngology | Admitting: Otolaryngology

## 2020-08-16 DIAGNOSIS — R42 Dizziness and giddiness: Secondary | ICD-10-CM

## 2020-08-16 DIAGNOSIS — H918X9 Other specified hearing loss, unspecified ear: Secondary | ICD-10-CM

## 2020-08-16 MED ORDER — GADOBENATE DIMEGLUMINE 529 MG/ML IV SOLN
19.0000 mL | Freq: Once | INTRAVENOUS | Status: AC | PRN
  Administered 2020-08-16: 19 mL via INTRAVENOUS

## 2020-10-28 ENCOUNTER — Encounter: Payer: Self-pay | Admitting: Podiatry

## 2020-10-28 ENCOUNTER — Ambulatory Visit (INDEPENDENT_AMBULATORY_CARE_PROVIDER_SITE_OTHER): Payer: Medicare Other | Admitting: Podiatry

## 2020-10-28 ENCOUNTER — Other Ambulatory Visit: Payer: Self-pay

## 2020-10-28 DIAGNOSIS — E1142 Type 2 diabetes mellitus with diabetic polyneuropathy: Secondary | ICD-10-CM

## 2020-10-28 DIAGNOSIS — B351 Tinea unguium: Secondary | ICD-10-CM

## 2020-10-28 DIAGNOSIS — L84 Corns and callosities: Secondary | ICD-10-CM | POA: Diagnosis not present

## 2020-10-28 DIAGNOSIS — M79676 Pain in unspecified toe(s): Secondary | ICD-10-CM

## 2020-10-31 NOTE — Progress Notes (Signed)
  Subjective:  Patient ID: Richard Weeks, male    DOB: Feb 19, 1933,  MRN: [id number]  87 y.o. male presents with at risk foot care with history of diabetic neuropathy and corn(s) left 3rd digit and painful thick toenails that are difficult to trim. Painful toenails interfere with ambulation. Aggravating factors include wearing enclosed shoe gear. Pain is relieved with periodic professional debridement. Painful corns are aggravated when weightbearing when wearing enclosed shoe gear. Pain is relieved with periodic professional debridement.  He is requesting coban wrap on his left 3rd digit and states that is the only device that keeps his toe comfortable.  Patient's blood sugar was 121 mg/dl today.  PCP is Dr. Marina Gravel and last visit was 3 months ago.  Review of Systems: Negative except as noted in the HPI.   Allergies  Allergen Reactions   Lisinopril Anaphylaxis    angioedema     Objective:  There were no vitals filed for this visit. Constitutional Patient is a pleasant 87 y.o. African American male in NAD. AAO x 3.  Vascular Capillary refill time to digits immediate b/l. Palpable DP pulse(s) b/l lower extremities Palpable PT pulse(s) b/l lower extremities Pedal hair sparse. Lower extremity skin temperature gradient within normal limits. No pain with calf compression b/l. Trace edema noted b/l lower extremities. No cyanosis or clubbing noted.  Neurologic Normal speech. Protective sensation diminished with 10g monofilament b/l.  Dermatologic Pedal skin with normal turgor, texture and tone b/l lower extremities Toenails 1-5 b/l elongated, discolored, dystrophic, thickened, crumbly with subungual debris and tenderness to dorsal palpation. Preulcerative lesion noted L 3rd toe. There is visible subdermal hemorrhage. There is no surrounding erythema, no edema, no drainage, no odor, no fluctuance.  Orthopedic: Normal muscle strength 5/5 to all lower extremity muscle groups bilaterally. No pain crepitus  or joint limitation noted with ROM b/l. Hammertoe(s) noted to the L 3rd toe.   No flowsheet data found.     Assessment:   1. Pain due to onychomycosis of toenail   2. Pre-ulcerative corn or callous   3. DM type 2 with diabetic peripheral neuropathy ([place])    Plan:  Patient was evaluated and treated and all questions answered.  Onychomycosis with pain -Nails palliatively debridement as below. -Educated on self-care  Procedure: Nail Debridement Rationale: Pain Type of Debridement: manual, sharp debridement. Instrumentation: Nail nipper, rotary burr. Number of Nails: 10  -Examined patient. -Patient to continue soft, supportive shoe gear daily. -Toenails 1-5 b/l were debrided in length and girth with sterile nail nippers and dremel without iatrogenic bleeding.  -Preulcerative lesion pared L 3rd toe.  -Patient to report any pedal injuries to medical professional immediately. -Applied toe cap created out of coban to left 3rd toe. He is to wear daily for protection of digit and prevention of distal preulcerative corn. -Patient/POA to call should there be question/concern in the interim.  Return in about 3 months (around 01/28/2021).  Marzetta Board, DPM

## 2021-02-10 ENCOUNTER — Ambulatory Visit (INDEPENDENT_AMBULATORY_CARE_PROVIDER_SITE_OTHER): Payer: Medicare Other | Admitting: Podiatry

## 2021-02-10 ENCOUNTER — Encounter: Payer: Self-pay | Admitting: Podiatry

## 2021-02-10 ENCOUNTER — Other Ambulatory Visit: Payer: Self-pay

## 2021-02-10 DIAGNOSIS — M79676 Pain in unspecified toe(s): Secondary | ICD-10-CM

## 2021-02-10 DIAGNOSIS — B351 Tinea unguium: Secondary | ICD-10-CM

## 2021-02-10 DIAGNOSIS — E1142 Type 2 diabetes mellitus with diabetic polyneuropathy: Secondary | ICD-10-CM

## 2021-02-10 DIAGNOSIS — L84 Corns and callosities: Secondary | ICD-10-CM | POA: Diagnosis not present

## 2021-02-16 NOTE — Progress Notes (Signed)
  Subjective:  Patient ID: Richard Weeks, male    DOB: 25-Jul-1932,  MRN: [id number]  87 y.o. male presents at risk foot care with history of diabetic neuropathy, painful thick toenails that are difficult to trim. Pain interferes with ambulation. Aggravating factors include wearing enclosed shoe gear. Pain is relieved with periodic professional debridement., and preulcerative lesion L 3rd toe.  Patient states blood glucose was 112 mg/dl today.    Patient states he would like to see Dr. Cannon Kettle regarding his left 3rd digit to see what can be done about it.   PCP is [name], MD , and last visit was September, 2022.  Allergies  Allergen Reactions   Lisinopril Anaphylaxis    angioedema     Review of Systems: Negative except as noted in the HPI.   Objective:  Vascular Examination: CFT <3 seconds b/l LE. Palpable DP pulse(s) b/l lower extremities Palpable PT pulse(s) b/l lower extremities Pedal hair absent. Lower extremity skin temperature gradient within normal limits. No pain with calf compression b/l. Trace edema noted BLE.  Neurological Examination: Protective sensation diminished with 10g monofilament b/l.  Dermatological Examination: Pedal skin is warm and supple b/l LE. No open wounds b/l LE. No interdigital macerations b/l lower extremities. Toenails 1-5 left and 1-5 right elongated, discolored, dystrophic, thickened, and crumbly with subungual debris and tenderness to dorsal palpation. Preulcerative lesion noted L 3rd toe. There is visible subdermal hemorrhage. There is no surrounding erythema, no edema, no drainage, no odor, no fluctuance.  Musculoskeletal Examination: Normal muscle strength 5/5 to all lower extremity muscle groups bilaterally. Hammertoe(s) noted to the L 3rd toe.  Radiographs: None Assessment:   1. Pain due to onychomycosis of toenail   2. Pre-ulcerative corn or callous   3. DM type 2 with diabetic peripheral neuropathy ([place])    Plan:  -Continue  diabetic foot care principles: inspect feet daily, monitor glucose as recommended by PCP and/or Endocrinologist, and follow prescribed diet per PCP, Endocrinologist and/or dietician. -Patient to continue soft, supportive shoe gear daily. -Mycotic toenails were debrided in length and girth with sterile nail nippers and dremel without iatrogenic bleeding. -Preulcerative lesion pared L 3rd toe. Total number pared=1. Light coban dressing applied for daily wear for cushioning. He has tried other padding, but this is what is most comfortable for him. -Patient to report any pedal injuries to medical professional immediately. -Patient/POA to call should there be question/concern in the interim. -Will schedule him with Dr. Cannon Kettle per patient request.  Return in about 3 months (around 05/13/2021).  Marzetta Board, DPM

## 2021-03-15 DIAGNOSIS — C679 Malignant neoplasm of bladder, unspecified: Secondary | ICD-10-CM | POA: Insufficient documentation

## 2021-05-10 ENCOUNTER — Other Ambulatory Visit: Payer: Self-pay

## 2021-05-10 ENCOUNTER — Ambulatory Visit (INDEPENDENT_AMBULATORY_CARE_PROVIDER_SITE_OTHER): Payer: Medicare Other | Admitting: Sports Medicine

## 2021-05-10 ENCOUNTER — Encounter: Payer: Self-pay | Admitting: Sports Medicine

## 2021-05-10 DIAGNOSIS — M79676 Pain in unspecified toe(s): Secondary | ICD-10-CM

## 2021-05-10 DIAGNOSIS — E1142 Type 2 diabetes mellitus with diabetic polyneuropathy: Secondary | ICD-10-CM

## 2021-05-10 DIAGNOSIS — M79675 Pain in left toe(s): Secondary | ICD-10-CM

## 2021-05-10 DIAGNOSIS — B351 Tinea unguium: Secondary | ICD-10-CM

## 2021-05-10 DIAGNOSIS — L84 Corns and callosities: Secondary | ICD-10-CM

## 2021-05-10 NOTE — Progress Notes (Signed)
Patient ID: Richard Weeks, male   DOB: 08/08/1932, 88 y.o.   MRN: [id number]   Subjective: Richard Weeks is a 88 y.o. male patient with history of type 2 diabetes who presents to office today complaining of long, painful nails  while ambulating in shoes and callus; unable to trim. Patient states that the pain at the left third toe is really bad due to the thick skin and wants to consider having it removed.  Patient denies any other pedal complaints at this time.  Patient Active Problem List   Diagnosis Date Noted   Malignant neoplasm of urinary bladder ([place]) 03/15/2021   Cataract 07/01/2020   Erectile dysfunction 07/01/2020   Benign prostatic hyperplasia 07/01/2020   Inguinal hernia 07/01/2020   Overactive bladder 07/01/2020   CKD stage 2 due to type 2 diabetes mellitus ([place]) 04/13/2020   COVID-19 04/06/2020   Pneumonia due to COVID-19 virus 04/06/2020   Angioedema 10/08/2017   Right lower lobe pneumonia 10/08/2017   Obstructive apnea 11/03/2014   Allergic rhinitis 09/30/2012   Cardiac enlargement 09/30/2012   Chronic obstructive pulmonary disease ([place]) 09/30/2012   Diaphragmatic hernia 09/30/2012   Diverticular disease of large intestine 09/30/2012   Long term current use of aspirin 09/30/2012   Acid reflux 09/30/2012   Essential (primary) hypertension 09/30/2012   Cardiac disease 09/30/2012   Hemorrhoid 09/30/2012   HLD (hyperlipidemia) 09/30/2012   Heart & renal disease, hypertensive, with heart failure ([place]) 09/30/2012   Decreased potassium in the blood 09/30/2012   LBP (low back pain) 09/30/2012   Lumbosacral spondylosis 09/30/2012   Arthralgia of lower leg 09/30/2012   Type 2 diabetes mellitus ([place]) 09/30/2012   Combined fat and carbohydrate induced hyperlipemia 12/19/2011   Hyperlipidemia due to type 2 diabetes mellitus ([place]) 12/19/2011   Benign hypertension 05/18/2011   Bilateral cataracts 05/18/2011   Diabetes mellitus ([place]) 05/18/2011   Apnea, sleep 05/18/2011    Pituitary adenoma ([place]) 05/01/2011   Diabetes mellitus 10/13/2010   Hypertension 10/13/2010   Muscle pain 10/13/2010   Bilateral swelling of feet 10/13/2010   Fatigue 10/13/2010   Chest pain 10/13/2010   Memory loss 10/13/2010   Bruises easily 10/13/2010   History of bladder problems 10/13/2010   Current Outpatient Medications on File Prior to Visit  Medication Sig Dispense Refill   acetaminophen (TYLENOL) 500 MG tablet Take by mouth.     albuterol (PROVENTIL HFA;VENTOLIN HFA) 108 (90 BASE) MCG/ACT inhaler Inhale into the lungs.     aspirin 325 MG tablet Take 325 mg by mouth.     Aspirin-Caffeine 500-32.5 MG TABS Take by mouth.     atenolol (TENORMIN) 50 MG tablet Take 50 mg by mouth.     ATENOLOL PO Take by mouth daily.       atorvastatin (LIPITOR) 10 MG tablet Take 10 mg by mouth.     atorvastatin (LIPITOR) 10 MG tablet Take by mouth.     baclofen (LIORESAL) 10 MG tablet Take 10 mg by mouth 3 (three) times daily as needed. for muscle spams  0   baclofen (LIORESAL) 10 MG tablet Take 10 mg by mouth.     budesonide (PULMICORT) 0.25 MG/2ML nebulizer solution 0.25 mg.     budesonide (RHINOCORT AQUA) 32 MCG/ACT nasal spray Frequency:PHARMDIR   Dosage:0.0     Instructions:  Note:2 Sprays / nostril QHS. Dose: 2 SPRAYS     budesonide (RHINOCORT AQUA) 32 MCG/ACT nasal spray Spray 2 squirts each nostril at bedtime     budesonide (RHINOCORT AQUA)  32 MCG/ACT nasal spray Spray 2 squirts each nostril at bedtime     capsaicin (ZOSTRIX) 0.025 % cream Frequency:PHARMDIR   Dosage:0.0     Instructions:  Note:Apply to knees up toTID as needed for pain.`E1o3L` Wash hands after use. Dose: 0.025 %     cephALEXin (KEFLEX) 250 MG capsule Take 250 mg by mouth 4 (four) times daily.     CLONIDINE HCL PO Take by mouth 2 (two) times daily.       diltiazem (CARDIZEM SR) 120 MG 12 hr capsule Take 120 mg by mouth 2 (two) times daily.  1   diltiazem (DILACOR XR) 120 MG 24 hr capsule Take 120 mg by mouth.      diltiazem (TIAZAC) 120 MG 24 hr capsule Take by mouth.     DILTIAZEM HCL PO Take by mouth 2 (two) times daily.       Dutasteride (AVODART PO) Take by mouth.       dutasteride (AVODART) 0.5 MG capsule Take 0.5 mg by mouth daily.       finasteride (PROSCAR) 5 MG tablet Take 5 mg by mouth.     finasteride (PROSCAR) 5 MG tablet Take 5 mg by mouth daily.  25   finasteride (PROSCAR) 5 MG tablet Take by mouth.     fluticasone (FLONASE) 50 MCG/ACT nasal spray Place 1 spray into both nostrils daily.     fluticasone (FLOVENT HFA) 220 MCG/ACT inhaler Inhale into the lungs.     Fluticasone-Salmeterol (ADVAIR DISKUS) 250-50 MCG/DOSE AEPB Inhale into the lungs.     Fluticasone-Salmeterol (ADVAIR HFA IN) Inhale into the lungs.       glimepiride (AMARYL) 1 MG tablet Take 1 mg by mouth.     GLIMEPIRIDE PO Take by mouth daily.       hydrALAZINE (APRESOLINE) 100 MG tablet Take 100 mg by mouth 3 (three) times daily.     hydrALAZINE (APRESOLINE) 50 MG tablet Take 50 mg by mouth.     hydrALAZINE (APRESOLINE) 50 MG tablet Increased to 3 pills a day     hydrALAZINE (APRESOLINE) 50 MG tablet Take by mouth.     HYDRALAZINE HCL PO Take 50 mg by mouth 2 (two) times daily.       HYDROcodone-acetaminophen (VICODIN) 5-500 MG per tablet Take by mouth.     HYDROcodone-homatropine (HYCODAN) 5-1.5 MG/5ML syrup   0   ibuprofen (ADVIL) 800 MG tablet Take 800 mg by mouth every 6 (six) hours as needed.     IPRATROPIUM BROMIDE NA Place into the nose every 4 (four) hours.       Ipratropium-Albuterol (ALBUTEROL-IPRATROPIUM IN) Inhale into the lungs every 4 (four) hours.       labetalol (NORMODYNE) 200 MG tablet Take 200 mg by mouth.     lactulose (CHRONULAC) 10 GM/15ML solution SMARTSIG:15 Milliliter(s) By Mouth 3 Times Daily PRN     linaclotide (LINZESS) 290 MCG CAPS capsule Linzess 290 mcg capsule     lisinopril (PRINIVIL,ZESTRIL) 10 MG tablet Take 10 mg by mouth daily.  2   lisinopril (PRINIVIL,ZESTRIL) 10 MG tablet Take 10 mg  by mouth.     loratadine (CLARITIN) 10 MG tablet Take 10 mg by mouth.     loratadine (CLARITIN) 10 MG tablet Take 10 mg by mouth.     loratadine (CLARITIN) 10 MG tablet Take by mouth.     LORazepam (ATIVAN) 0.5 MG tablet   2   meclizine (ANTIVERT) 12.5 MG tablet Take 12.5 mg by mouth.  meloxicam (MOBIC) 15 MG tablet Take 15 mg by mouth daily.     meloxicam (MOBIC) 15 MG tablet Take by mouth.     meloxicam (MOBIC) 7.5 MG tablet Take 7.5 mg by mouth daily. with food  3   METFORMIN HCL PO Take by mouth daily.       mometasone (ASMANEX 120 METERED DOSES) 220 MCG/INH inhaler Frequency:PHARMDIR   Dosage:0.0     Instructions:  Note:Inhale 1 inhalation twice daily. Rinse mouth after using`E1o3L` 3 month supply Dose: ONE     mometasone (ASMANEX) 220 MCG/INH inhaler Inhale 2 puffs into the lungs daily.       mometasone (ASMANEX) 220 MCG/INH inhaler Inhale into the lungs.     mometasone (ASMANEX, 120 METERED DOSES,) 220 MCG/ACT inhaler Inhale into the lungs.     mometasone (NASONEX) 50 MCG/ACT nasal spray 2 sprays daily.     mometasone-formoterol (DULERA) 100-5 MCG/ACT AERO Inhale into the lungs.     montelukast (SINGULAIR) 10 MG tablet Take 10 mg by mouth.     montelukast (SINGULAIR) 10 MG tablet Take by mouth.     Montelukast Sodium (SINGULAIR PO) Take by mouth daily.       nitroGLYCERIN (NITROSTAT) 0.4 MG SL tablet Place 0.4 mg under the tongue.     Omega-3 Fatty Acids (OMEGA 3 PO) Take 2 capsules by mouth daily.       omeprazole (PRILOSEC) 20 MG capsule TAKE 1 CAPSULE DAILY     oxyCODONE-acetaminophen (PERCOCET/ROXICET) 5-325 MG tablet oxycodone-acetaminophen 5 mg-325 mg tablet     pantoprazole (PROTONIX) 40 MG tablet pantoprazole 40 mg tablet,delayed release  TAKE 1 TABLET BY MOUTH DAILY     pantoprazole (PROTONIX) 40 MG tablet Take 40 mg by mouth daily.  12   phenazopyridine (PYRIDIUM) 200 MG tablet Take 200 mg by mouth 3 (three) times daily as needed.     Potassium Chlorate GRAN by Does not  apply route 2 (two) times daily.       potassium chloride (KLOR-CON) 10 MEQ tablet Take 10 mEq by mouth daily.     potassium chloride SA (K-DUR,KLOR-CON) 20 MEQ tablet Take by mouth.     potassium chloride SA (KLOR-CON M20) 20 MEQ tablet Take by mouth.     predniSONE (DELTASONE) 10 MG tablet Take 30 mg by mouth daily.     Ranitidine HCl 150 MG PACK Take 150 mg by mouth 2 (two) times daily.       sildenafil (VIAGRA) 50 MG tablet Take by mouth.     solifenacin (VESICARE) 5 MG tablet Take 10 mg by mouth.     solifenacin (VESICARE) 5 MG tablet Take 5 mg by mouth.     sulfamethoxazole-trimethoprim (BACTRIM DS,SEPTRA DS) 800-160 MG tablet sulfamethoxazole 800 mg-trimethoprim 160 mg tablet     tamsulosin (FLOMAX) 0.4 MG CAPS capsule Take 0.4 mg by mouth.     TAMSULOSIN HCL PO Take by mouth daily.       terazosin (HYTRIN) 2 MG capsule Take 2 mg by mouth.     TERAZOSIN HCL PO Take 2 mg by mouth daily.       tiotropium (SPIRIVA HANDIHALER) 18 MCG inhalation capsule Place into inhaler and inhale.     tiotropium (SPIRIVA) 18 MCG inhalation capsule Place into inhaler and inhale.     tiotropium (SPIRIVA) 18 MCG inhalation capsule Place into inhaler and inhale.     traZODone (DESYREL) 50 MG tablet trazodone 50 mg tablet     TRIAMTERENE PO Take by mouth  daily.       triamterene-hydrochlorothiazide (MAXZIDE-25) 37.5-25 MG per tablet Take by mouth.     zinc gluconate 50 MG tablet Take by mouth.     zinc gluconate 50 MG tablet Take by mouth.     No current facility-administered medications on file prior to visit.   Allergies  Allergen Reactions   Lisinopril Anaphylaxis    angioedema    Oxycodone-Acetaminophen Other (See Comments)    delirium   Objective: General: Patient is awake, alert, and oriented x 3 and in no acute distress.  Integument: Skin is warm, dry and supple bilateral. Nails are tender, long, thickened and dystrophic with subungual debris, consistent with onychomycosis, 1-5 bilateral.  No signs of infection. No open lesions present bilateral. Mild reactive keratosis at left 3rd toe distal tuft and dry skin. Remaining integument unremarkable.  Vasculature:  Dorsalis Pedis pulse 1/4 bilateral. Posterior Tibial pulse  1/4 bilateral. Capillary fill time <3 sec 1-5 bilateral. Scant hair growth to the level of the digits.Temperature gradient within normal limits. No varicosities present bilateral. No edema present bilateral.   Neurology: The patient has diminished sensation measured with a 5.07/10g Semmes Weinstein Monofilament at all pedal sites bilateral. Vibratory sensation diminished bilateral with tuning fork. No Babinski sign present bilateral.   Musculoskeletal: Asymptomatic bunion, hammertoes deformities and pes planus bilateral. Muscular strength 5/5 in all lower extremity muscular groups bilateral without pain or limitation on range of motion. No tenderness with calf compression bilateral.  Assessment and Plan: Problem List Items Addressed This Visit   None Visit Diagnoses     Pain due to onychomycosis of toenail    -  Primary   Relevant Medications   cephALEXin (KEFLEX) 250 MG capsule   Pre-ulcerative corn or callous       DM type 2 with diabetic peripheral neuropathy ([facility])       Relevant Medications   atorvastatin (LIPITOR) 10 MG tablet   Toe pain, left          -Examined patient. -Re-discussed and educated patient on diabetic foot care, especially with regards to the vascular, neurological and musculoskeletal systems.  -Mechanically debrided keratosis at tip of left 3rd toe using rotary bur and all nails 1-5 bilateral using sterile nail nipper and filed with dremel without incident  -Discussed with patient possibility of partial third toe amputation since painful corn is very severe; patient wants to have surgery and states that he has his wife at home who can help him with recovery and a nephew who could bring him to the hospital when it is time for  surgery -Continue with Neuro follow up regarding other issues and his back which may be adding to numbness and weakness in his legs -Patient to return in 1 month for surgery consult and in 3 months for at risk foot/nail care -Patient advised to call the office if any problems or questions arise in the meantime.  Landis Martins, DPM

## 2021-06-17 ENCOUNTER — Ambulatory Visit: Payer: Medicare Other | Admitting: Sports Medicine

## 2021-07-04 ENCOUNTER — Ambulatory Visit: Payer: Medicare Other | Admitting: Podiatry

## 2021-08-18 ENCOUNTER — Other Ambulatory Visit: Payer: Self-pay | Admitting: *Deleted

## 2021-08-18 ENCOUNTER — Encounter: Payer: Self-pay | Admitting: Podiatry

## 2021-08-18 ENCOUNTER — Ambulatory Visit (INDEPENDENT_AMBULATORY_CARE_PROVIDER_SITE_OTHER): Payer: Medicare Other | Admitting: Podiatry

## 2021-08-18 DIAGNOSIS — L84 Corns and callosities: Secondary | ICD-10-CM | POA: Diagnosis not present

## 2021-08-18 DIAGNOSIS — M79675 Pain in left toe(s): Secondary | ICD-10-CM

## 2021-08-18 DIAGNOSIS — E1142 Type 2 diabetes mellitus with diabetic polyneuropathy: Secondary | ICD-10-CM | POA: Diagnosis not present

## 2021-08-18 DIAGNOSIS — M79674 Pain in right toe(s): Secondary | ICD-10-CM

## 2021-08-18 DIAGNOSIS — E119 Type 2 diabetes mellitus without complications: Secondary | ICD-10-CM

## 2021-08-18 DIAGNOSIS — M2041 Other hammer toe(s) (acquired), right foot: Secondary | ICD-10-CM

## 2021-08-18 DIAGNOSIS — B351 Tinea unguium: Secondary | ICD-10-CM | POA: Diagnosis not present

## 2021-08-18 DIAGNOSIS — M2042 Other hammer toe(s) (acquired), left foot: Secondary | ICD-10-CM | POA: Diagnosis not present

## 2021-08-28 NOTE — Progress Notes (Signed)
ANNUAL DIABETIC FOOT EXAM ? ?Subjective: ?Richard Weeks presents today for annual diabetic foot examination. ? ?Patient relates 10 year h/o diabetes. ? ?Patient has h/o preulcerative lesion of  L 3rd toe, which healed via help of periodic debridement. ? ?Patient has been diagnosed with neuropathy3 ? ?Patient's blood sugar was 115 mg/dl today. Last known  HgA1c was unknown by patient  ? ?Risk factors: diabetes, diabetic neuropathy, HTN, CAD, COPD, CKD, hyperlipidemia, h/o tobacco use in remission. ? ?[name], MD is patient's PCP. Last visit was July 14, 2021. ? ?Past Medical History:  ?Diagnosis Date  ? Bruises easily   ? Diabetes mellitus   ? Emphysema   ? Fatigue   ? High blood pressure   ? History of bladder problems   ? Memory loss   ? Muscle cramps   ? pain  ? Swelling of lower limb   ? Legs and feet  ? ?Patient Active Problem List  ? Diagnosis Date Noted  ? Malignant neoplasm of urinary bladder ([name]) 03/15/2021  ? Cataract 07/01/2020  ? Erectile dysfunction 07/01/2020  ? Benign prostatic hyperplasia 07/01/2020  ? Inguinal hernia 07/01/2020  ? Overactive bladder 07/01/2020  ? CKD stage 2 due to type 2 diabetes mellitus ([name]) 04/13/2020  ? COVID-19 04/06/2020  ? Pneumonia due to COVID-19 virus 04/06/2020  ? Angioedema 10/08/2017  ? Right lower lobe pneumonia 10/08/2017  ? Obstructive apnea 11/03/2014  ? Allergic rhinitis 09/30/2012  ? Cardiac enlargement 09/30/2012  ? Chronic obstructive pulmonary disease ([name]) 09/30/2012  ? Diaphragmatic hernia 09/30/2012  ? Diverticular disease of large intestine 09/30/2012  ? Long term current use of aspirin 09/30/2012  ? Acid reflux 09/30/2012  ? Essential (primary) hypertension 09/30/2012  ? Cardiac disease 09/30/2012  ? Hemorrhoid 09/30/2012  ? HLD (hyperlipidemia) 09/30/2012  ? Heart & renal disease, hypertensive, with heart failure ([place]) 09/30/2012  ? Decreased potassium in the blood 09/30/2012  ? LBP (low back pain) 09/30/2012  ? Lumbosacral spondylosis  09/30/2012  ? Arthralgia of lower leg 09/30/2012  ? Type 2 diabetes mellitus ([name]) 09/30/2012  ? Combined fat and carbohydrate induced hyperlipemia 12/19/2011  ? Hyperlipidemia due to type 2 diabetes mellitus ([name]) 12/19/2011  ? Benign hypertension 05/18/2011  ? Bilateral cataracts 05/18/2011  ? Diabetes mellitus ([name]) 05/18/2011  ? Apnea, sleep 05/18/2011  ? Pituitary adenoma ([name]) 05/01/2011  ? Diabetes mellitus 10/13/2010  ? Hypertension 10/13/2010  ? Muscle pain 10/13/2010  ? Bilateral swelling of feet 10/13/2010  ? Fatigue 10/13/2010  ? Chest pain 10/13/2010  ? Memory loss 10/13/2010  ? Bruises easily 10/13/2010  ? History of bladder problems 10/13/2010  ? ?History reviewed. No pertinent surgical history. ?Current Outpatient Medications on File Prior to Visit  ?Medication Sig Dispense Refill  ? acetaminophen (TYLENOL) 500 MG tablet Take by mouth.    ? albuterol (PROVENTIL HFA;VENTOLIN HFA) 108 (90 BASE) MCG/ACT inhaler Inhale into the lungs.    ? aspirin 325 MG tablet Take 325 mg by mouth.    ? Aspirin-Caffeine 500-32.5 MG TABS Take by mouth.    ? atenolol (TENORMIN) 50 MG tablet Take 50 mg by mouth.    ? ATENOLOL PO Take by mouth daily.      ? atorvastatin (LIPITOR) 10 MG tablet Take 10 mg by mouth.    ? atorvastatin (LIPITOR) 10 MG tablet Take by mouth.    ? baclofen (LIORESAL) 10 MG tablet Take 10 mg by mouth 3 (three) times daily as needed. for muscle spams  0  ?  baclofen (LIORESAL) 10 MG tablet Take 10 mg by mouth.    ? budesonide (PULMICORT) 0.25 MG/2ML nebulizer solution 0.25 mg.    ? budesonide (RHINOCORT AQUA) 32 MCG/ACT nasal spray Frequency:PHARMDIR   Dosage:0.0     Instructions:  Note:2 Sprays / nostril QHS. Dose: 2 SPRAYS    ? budesonide (RHINOCORT AQUA) 32 MCG/ACT nasal spray Spray 2 squirts each nostril at bedtime    ? budesonide (RHINOCORT AQUA) 32 MCG/ACT nasal spray Spray 2 squirts each nostril at bedtime    ? capsaicin (ZOSTRIX) 0.025 % cream Frequency:PHARMDIR   Dosage:0.0     Instructions:   Note:Apply to knees up toTID as needed for pain.`E1o3L` Wash hands after use. Dose: 0.025 %    ? cephALEXin (KEFLEX) 250 MG capsule Take 250 mg by mouth 4 (four) times daily.    ? cloNIDine (CATAPRES) 0.2 MG tablet clonidine HCl 0.2 mg tablet ? Take 1 tablet twice a day by oral route.    ? CLONIDINE HCL PO Take by mouth 2 (two) times daily.      ? diltiazem (CARDIZEM SR) 120 MG 12 hr capsule Take 120 mg by mouth 2 (two) times daily.  1  ? diltiazem (DILACOR XR) 120 MG 24 hr capsule Take 120 mg by mouth.    ? diltiazem (TIAZAC) 120 MG 24 hr capsule Take by mouth.    ? DILTIAZEM HCL PO Take by mouth 2 (two) times daily.      ? Dutasteride (AVODART PO) Take by mouth.      ? dutasteride (AVODART) 0.5 MG capsule Take 0.5 mg by mouth daily.      ? finasteride (PROSCAR) 5 MG tablet Take 5 mg by mouth.    ? finasteride (PROSCAR) 5 MG tablet Take 5 mg by mouth daily.  25  ? finasteride (PROSCAR) 5 MG tablet Take by mouth.    ? fluticasone (FLONASE) 50 MCG/ACT nasal spray Place 1 spray into both nostrils daily.    ? fluticasone (FLOVENT HFA) 220 MCG/ACT inhaler Inhale into the lungs.    ? Fluticasone-Salmeterol (ADVAIR DISKUS) 250-50 MCG/DOSE AEPB Inhale into the lungs.    ? Fluticasone-Salmeterol (ADVAIR HFA IN) Inhale into the lungs.      ? glimepiride (AMARYL) 1 MG tablet Take 1 mg by mouth.    ? GLIMEPIRIDE PO Take by mouth daily.      ? hydrALAZINE (APRESOLINE) 100 MG tablet Take 100 mg by mouth 3 (three) times daily.    ? hydrALAZINE (APRESOLINE) 50 MG tablet Take 50 mg by mouth.    ? hydrALAZINE (APRESOLINE) 50 MG tablet Increased to 3 pills a day    ? hydrALAZINE (APRESOLINE) 50 MG tablet Take by mouth.    ? HYDRALAZINE HCL PO Take 50 mg by mouth 2 (two) times daily.      ? HYDROcodone-acetaminophen (VICODIN) 5-500 MG per tablet Take by mouth.    ? HYDROcodone-homatropine (HYCODAN) 5-1.5 MG/5ML syrup   0  ? ibuprofen (ADVIL) 800 MG tablet Take 800 mg by mouth every 6 (six) hours as needed.    ? IPRATROPIUM BROMIDE NA  Place into the nose every 4 (four) hours.      ? Ipratropium-Albuterol (ALBUTEROL-IPRATROPIUM IN) Inhale into the lungs every 4 (four) hours.      ? labetalol (NORMODYNE) 200 MG tablet Take 200 mg by mouth.    ? lactulose (CHRONULAC) 10 GM/15ML solution SMARTSIG:15 Milliliter(s) By Mouth 3 Times Daily PRN    ? linaclotide (LINZESS) 290 MCG CAPS capsule  Linzess 290 mcg capsule    ? lisinopril (PRINIVIL,ZESTRIL) 10 MG tablet Take 10 mg by mouth daily.  2  ? lisinopril (PRINIVIL,ZESTRIL) 10 MG tablet Take 10 mg by mouth.    ? loratadine (CLARITIN) 10 MG tablet Take 10 mg by mouth.    ? loratadine (CLARITIN) 10 MG tablet Take 10 mg by mouth.    ? loratadine (CLARITIN) 10 MG tablet Take by mouth.    ? LORazepam (ATIVAN) 0.5 MG tablet   2  ? meclizine (ANTIVERT) 12.5 MG tablet Take 12.5 mg by mouth.    ? meloxicam (MOBIC) 15 MG tablet Take 15 mg by mouth daily.    ? meloxicam (MOBIC) 15 MG tablet Take by mouth.    ? meloxicam (MOBIC) 7.5 MG tablet Take 7.5 mg by mouth daily. with food  3  ? METFORMIN HCL PO Take by mouth daily.      ? mometasone (ASMANEX 120 METERED DOSES) 220 MCG/INH inhaler Frequency:PHARMDIR   Dosage:0.0     Instructions:  Note:Inhale 1 inhalation twice daily. Rinse mouth after using`E1o3L` 3 month supply Dose: ONE    ? mometasone (ASMANEX) 220 MCG/INH inhaler Inhale 2 puffs into the lungs daily.      ? mometasone (ASMANEX) 220 MCG/INH inhaler Inhale into the lungs.    ? mometasone (ASMANEX, 120 METERED DOSES,) 220 MCG/ACT inhaler Inhale into the lungs.    ? mometasone (NASONEX) 50 MCG/ACT nasal spray 2 sprays daily.    ? mometasone-formoterol (DULERA) 100-5 MCG/ACT AERO Inhale into the lungs.    ? montelukast (SINGULAIR) 10 MG tablet Take 10 mg by mouth.    ? montelukast (SINGULAIR) 10 MG tablet Take by mouth.    ? Montelukast Sodium (SINGULAIR PO) Take by mouth daily.      ? nitroGLYCERIN (NITROSTAT) 0.4 MG SL tablet Place 0.4 mg under the tongue.    ? Omega-3 Fatty Acids (OMEGA 3 PO) Take 2 capsules  by mouth daily.      ? omeprazole (PRILOSEC) 20 MG capsule TAKE 1 CAPSULE DAILY    ? oxyCODONE-acetaminophen (PERCOCET/ROXICET) 5-325 MG tablet oxycodone-acetaminophen 5 mg-325 mg tablet    ? pantoprazole (P

## 2021-09-07 DIAGNOSIS — M4802 Spinal stenosis, cervical region: Secondary | ICD-10-CM | POA: Insufficient documentation

## 2021-09-08 DIAGNOSIS — R339 Retention of urine, unspecified: Secondary | ICD-10-CM | POA: Insufficient documentation

## 2021-09-08 DIAGNOSIS — F411 Generalized anxiety disorder: Secondary | ICD-10-CM | POA: Insufficient documentation

## 2021-09-21 ENCOUNTER — Other Ambulatory Visit: Payer: Medicare Other

## 2021-11-03 DIAGNOSIS — Z981 Arthrodesis status: Secondary | ICD-10-CM | POA: Insufficient documentation

## 2021-11-17 ENCOUNTER — Ambulatory Visit (INDEPENDENT_AMBULATORY_CARE_PROVIDER_SITE_OTHER): Payer: Medicare Other | Admitting: Podiatry

## 2021-11-17 DIAGNOSIS — E1142 Type 2 diabetes mellitus with diabetic polyneuropathy: Secondary | ICD-10-CM | POA: Diagnosis not present

## 2021-11-17 DIAGNOSIS — B351 Tinea unguium: Secondary | ICD-10-CM | POA: Diagnosis not present

## 2021-11-17 DIAGNOSIS — Q828 Other specified congenital malformations of skin: Secondary | ICD-10-CM

## 2021-11-17 DIAGNOSIS — M79676 Pain in unspecified toe(s): Secondary | ICD-10-CM

## 2021-11-20 IMAGING — MR MR HEAD WO/W CM
16 series · 45 of 48 positions shown · IV contrast (19 Multihance)
Comparison: MRI September 16, 2010.  CT head a 12/08/2019.

CLINICAL DATA: Vertigo, asymmetric hearing loss.  Dizziness.

EXAM:
MRI HEAD WITHOUT AND WITH CONTRAST
TECHNIQUE: Multiplanar, multiecho pulse sequences of the brain and surrounding
structures were obtained without and with intravenous contrast.
CONTRAST:  19mL MULTIHANCE GADOBENATE DIMEGLUMINE 529 MG/ML IV SOLN

[Series 2: T1 · sagittal · 5.0mm · 0.45mm/px · 1 of 23 slices shown (1 of 6)]
[im 1/23]
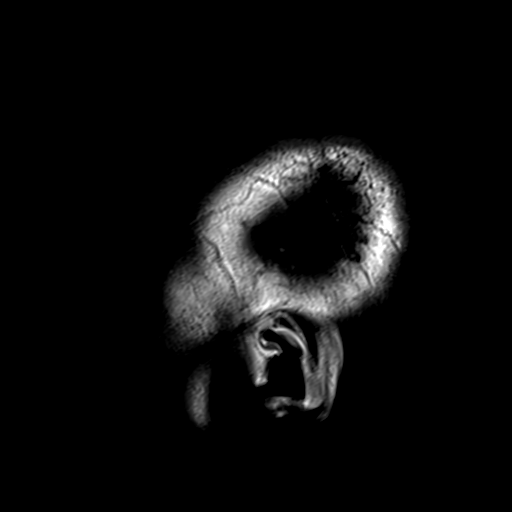

[Series 3: ax ep2d_diff_3 · axial · 3.0mm · 1.80mm/px · z∈[-37,+108]mm · 5 of 99 slices shown]
[im 1/99]
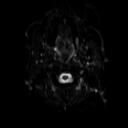
[im 25/99]
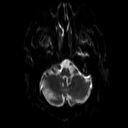
[im 50/99]
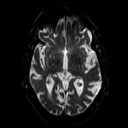
[im 74/99]
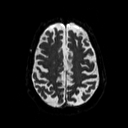
[im 99/99]
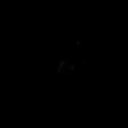

[Series 4: ax ep2d_diff_3_adc · axial · 3.0mm · 1.80mm/px · z∈[-37,+108]mm · 2 of 50 slices shown]
[im 1/50]
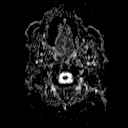
[im 50/50]
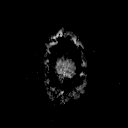

[Series 5: cor ep2d_diffusion · coronal · 5.0mm · 1.77mm/px · 2 of 56 slices shown]
[im 1/56]
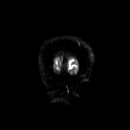
[im 56/56]
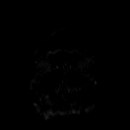

[Series 6: cor ep2d_diffusion_adc · coronal · 5.0mm · 1.77mm/px · 2 of 28 slices shown]
[im 1/28]
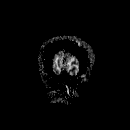
[im 28/28]
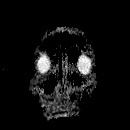

[Series 7: FLAIR · axial · 3.3mm · 0.43mm/px · z∈[-41,+110]mm · 2 of 34 slices shown]
[im 1/34]
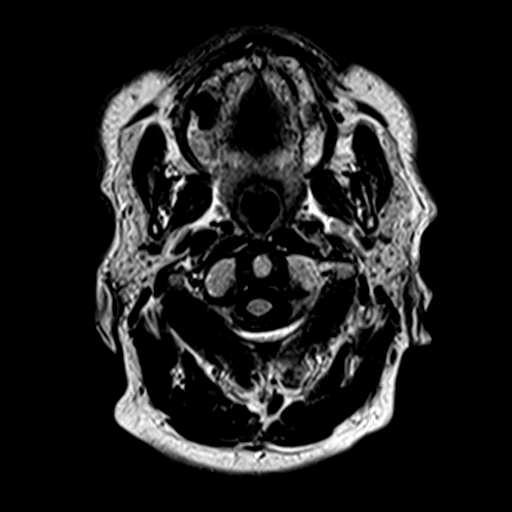
[im 34/34]
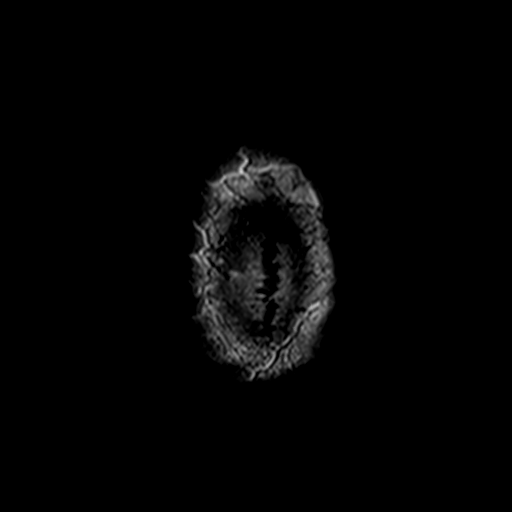

[Series 8: T2 · axial · 5.0mm · 0.72mm/px · z∈[-43,+112]mm · 2 of 27 slices shown]
[im 1/27]
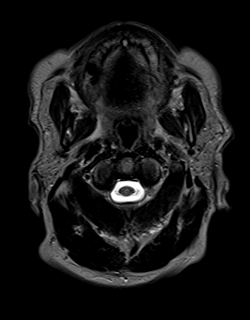
[im 27/27]
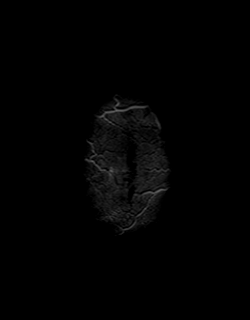

[Series 10: swi_images · axial · 2.0mm · 0.90mm/px · z∈[-44,+113]mm · 5 of 80 slices shown]
[im 1/80]
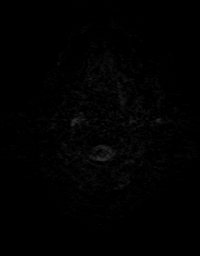
[im 20/80]
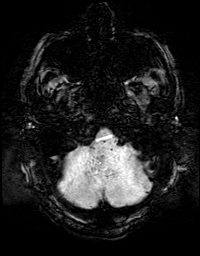
[im 40/80]
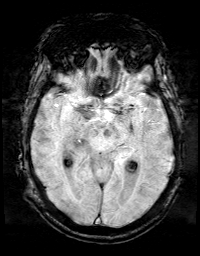
[im 60/80]
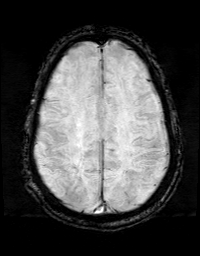
[im 80/80]
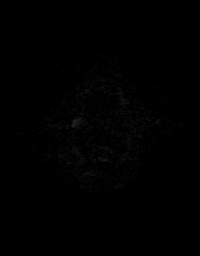

[Series 11: T1 · axial · 1.0mm · 0.72mm/px · z∈[-36,+105]mm · 8 of 144 slices shown (2 of 6)]
[im 1/144]
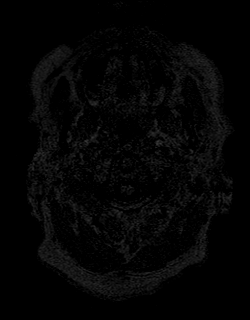
[im 18/144]
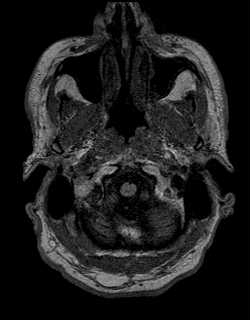
[im 36/144]
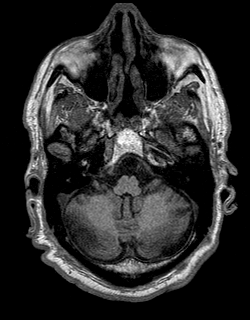
[im 54/144]
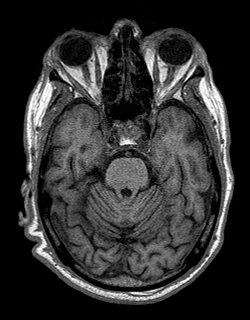
[im 90/144]
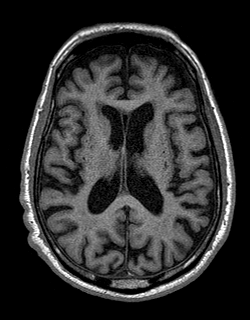
[im 108/144]
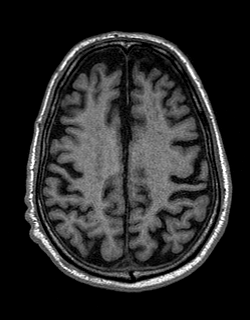
[im 126/144]
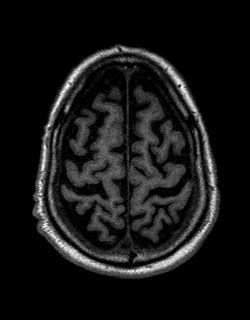
[im 144/144]
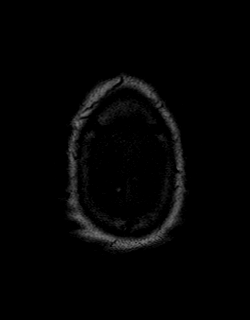

[Series 12: T1 · coronal · 3.0mm · 0.35mm/px · 1 of 13 slices shown (3 of 6)]
[im 1/13]
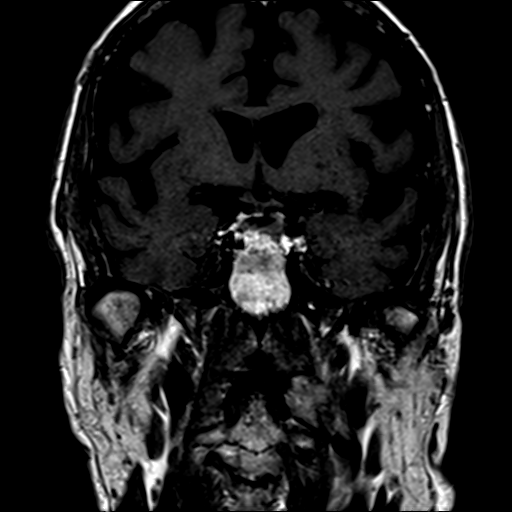

[Series 13: T1 · axial · 3.0mm · 0.35mm/px · 1 of 11 slices shown (4 of 6)]
[im 1/11]
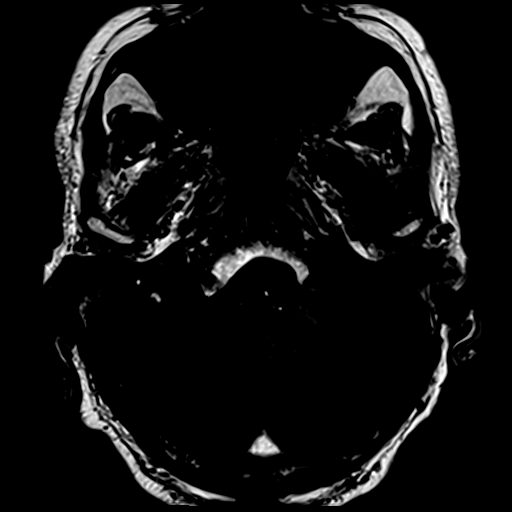

[Series 14: bSSFP · axial · 0.7mm · 0.28mm/px · z∈[-15,-1]mm · 2 of 44 slices shown]
[im 1/44]
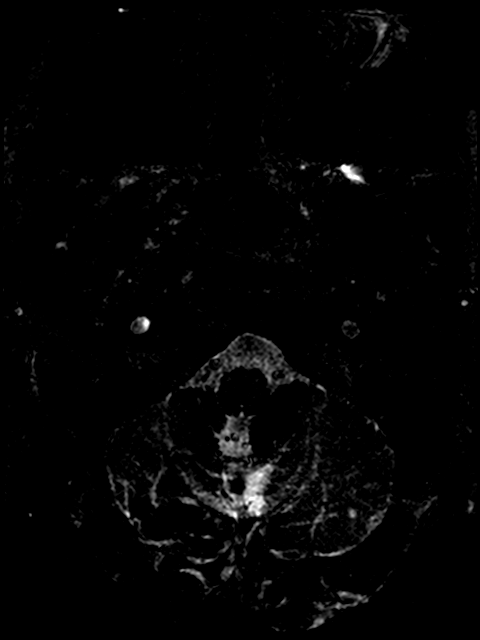
[im 22/44]
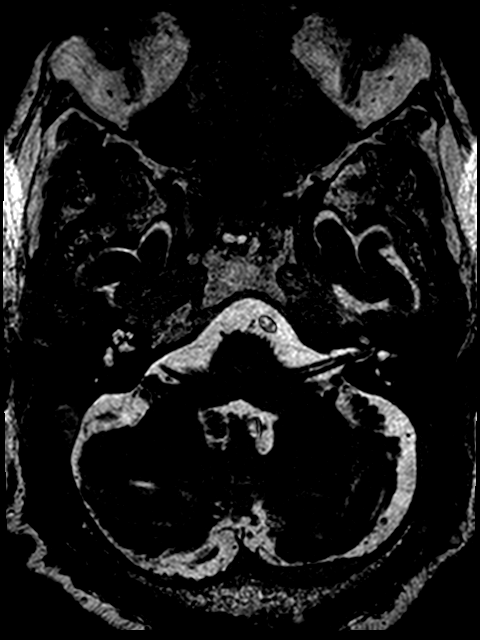

[Series 15: T1 · coronal · 3.0mm · 0.35mm/px · 1 of 13 slices shown (5 of 6)]
[im 1/13]
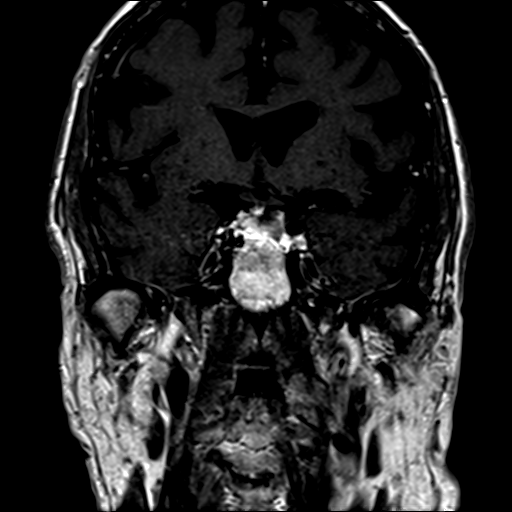

[Series 16: T1 post-contrast · axial · 3.0mm · 0.35mm/px · 1 of 11 slices shown (1 of 2)]
[im 1/11]
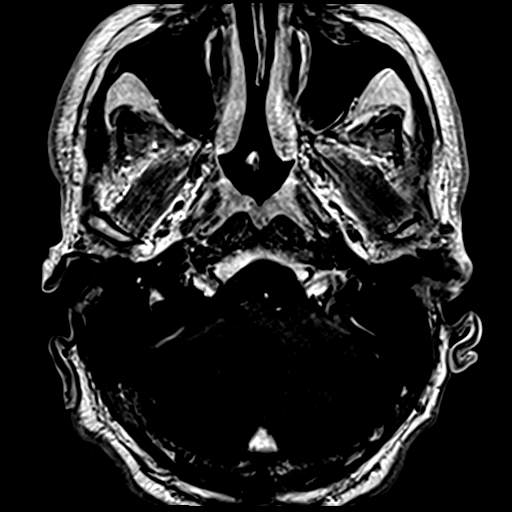

[Series 17: T1 · axial · 1.0mm · 0.72mm/px · z∈[-36,+105]mm · 8 of 144 slices shown (6 of 6)]
[im 1/144]
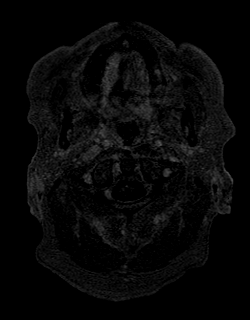
[im 18/144]
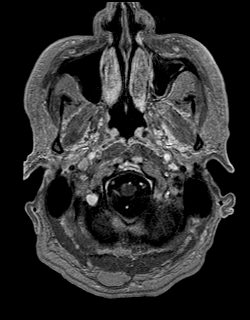
[im 36/144]
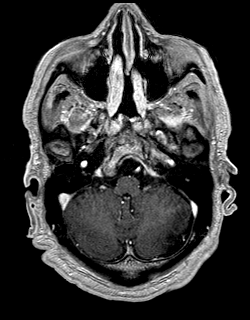
[im 54/144]
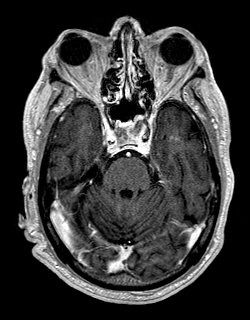
[im 90/144]
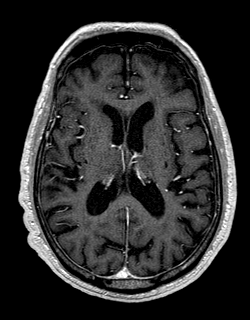
[im 108/144]
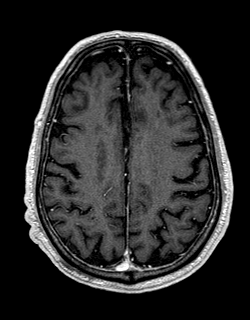
[im 126/144]
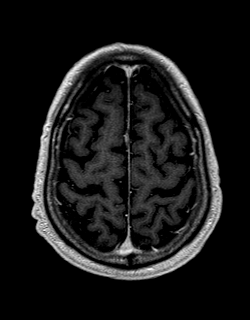
[im 144/144]
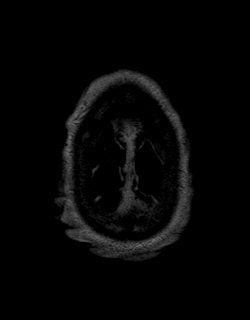

[Series 18: T1 post-contrast · coronal · 5.0mm · 0.72mm/px · 2 of 26 slices shown (2 of 2)]
[im 1/26]
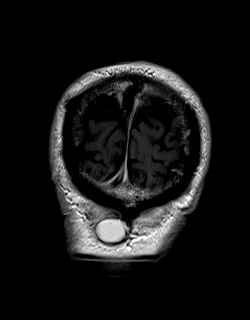
[im 26/26]
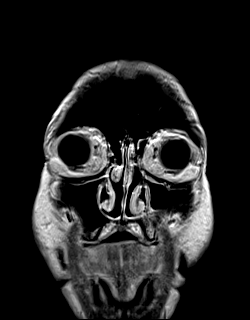

[45 of 48 positions shown; findings below may reference images not displayed]

FINDINGS: Brain: No acute infarct. Remote infarcts in the left thalamus, left
aspect of the genu of the corpus callosum, and left basal ganglia.
Additional dilated perivascular spaces in bilateral basal ganglia.
Moderate generalized cerebral volume loss. Mild for age scattered
T2/FLAIR hyperintensities within the white matter, nonspecific but
likely related to chronic microvascular ischemic disease. No acute
hemorrhage. Focus of susceptibility artifact in the right thalamus,
compatible with prior microhemorrhage. No extra-axial fluid
collection. No abnormal enhancement outside of the sella.

Postsurgical changes of transsphenoidal resection of a pituitary
macro adenoma. There is homogeneously enhancing soft tissue in the
right aspect of the sella, likely pituitary. There is nonenhancing
soft tissue within the left aspect of the sella which measures up to
1 cm and likely represents residual tumor (series 18, image 17).
Unremarkable optic chiasm. The infundibulum is deviated to the
right.

Dedicated IAC protocol was performed. Unremarkable appearance of the
visualized no abnormal enhancement or evidence of a retrocochlear
mass. Normal CSF signal within the labyrinth bilaterally. Seventh
and eighth cranial nerves. No sizable mastoid effusions.
Unremarkable appearance of adjacent dural venous sinuses and jugular
bulbs. Asymmetric marrow in the right petrous apex

Vascular: Major arterial flow voids are maintained at the skull
base.

Skull and upper cervical spine: Normal marrow signal. Incidental
lipoma along the posterior right paramidline upper neck.

Sinuses/Orbits: Mucosal thickening of scattered ethmoid air cells
and maxillary sinuses. Unremarkable orbits.
IMPRESSION: 1. No evidence of acute intracranial abnormality or retrocochlear
mass.
2. Postsurgical changes of transsphenoidal resection of a pituitary
macro adenoma. Nonenhancing soft tissue within the left aspect of
the sella which measures up to 1 cm and likely represents residual
tumor. No postoperative MRI is available for comparison to assess
stability. Comparison with outside priors may be helpful.
3. Moderate generalized cerebral atrophy, chronic microvascular
ischemic disease and multiple remote lacunar infarcts, as detailed
above.

## 2021-11-21 ENCOUNTER — Encounter: Payer: Self-pay | Admitting: Podiatry

## 2021-11-21 NOTE — Progress Notes (Signed)
  Subjective:  Patient ID: Richard Weeks, male    DOB: 07-03-1932,  MRN: [id number]  Richard Weeks presents to clinic today for at risk foot care with history of diabetic neuropathy and painful porokeratotic lesion(s) b/l lower extremities and painful mycotic toenails that limit ambulation. Painful toenails interfere with ambulation. Aggravating factors include wearing enclosed shoe gear. Pain is relieved with periodic professional debridement. Painful porokeratotic lesions are aggravated when weightbearing with and without shoegear. Pain is relieved with periodic professional debridement.  Patient states blood glucose was 118 mg/dl today.  Last known HgA1c was unknown.    Patient states he had neck surgery and is recovering. He developed pressure injury to left heel and it is being treated by Home Health.  New problem(s): None.   PCP is [name], MD , and last visit was  Aug 29, 2021  Allergies  Allergen Reactions   Lisinopril Anaphylaxis    angioedema    Oxycodone    Oxycodone-Acetaminophen Other (See Comments)    delirium   Review of Systems: Negative except as noted in the HPI.  Objective: No changes noted in today's physical examination. There were no vitals filed for this visit.  Richard Weeks is a pleasant 88 y.o. male in NAD. AAO X 3.  Vascular Examination: CFT <3 seconds b/l LE. Faintly palpable DP pulses b/l LE. Faintly palpable PT pulse(s) b/l LE. Pedal hair sparse. No pain with calf compression b/l. Lower extremity skin temperature gradient within normal limits. Trace edema noted BLE. No ischemia or gangrene noted b/l LE. No cyanosis or clubbing noted b/l LE.  Dermatological Examination: Pedal skin is warm and supple b/l LE.Dressing noted left heel which is clean, dry and intact. No interdigital macerations noted b/l LE. Toenails 1-5 bilaterally elongated, discolored, dystrophic, thickened, and crumbly with subungual debris and tenderness to dorsal palpation.  Porokeratotic lesions noted b/l 3rd toes. There is no surrounding erythema, no edema, no drainage, no odor, no fluctuance.  Neurological Examination: Patient has known radiculopathy of lumbar spine. Protective sensation diminished with 10g monofilament b/l. Vibratory sensation diminished b/l. Babinski reflex negative b/l. Clonus negative b/l.  Musculoskeletal Examination: Muscle strength 5/5 to all lower extremity muscle groups bilaterally. No pain, crepitus or joint limitation noted with ROM bilateral LE. HAV with bunion deformity noted b/l LE. Hammertoe deformity noted 2-5 b/l. Pes planus deformity noted bilateral LE.  Assessment/Plan: 1. Pain due to onychomycosis of toenail   2. Porokeratosis   3. DM type 2 with diabetic peripheral neuropathy ([place])   -Examined patient. -Patient to continue soft, supportive shoe gear daily. -Mycotic toenails 1-5 bilaterally were debrided in length and girth with sterile nail nippers and dremel without incident. -Porokeratotic lesion(s) distal tip of left 3rd toe and distal tip of right 3rd toe pared and enucleated with sterile scalpel blade without incident. Total number of lesions debrided=2. -Patient/POA to call should there be question/concern in the interim.   Return in about 3 months (around 02/17/2022).  Marzetta Board, DPM

## 2022-03-02 ENCOUNTER — Ambulatory Visit (INDEPENDENT_AMBULATORY_CARE_PROVIDER_SITE_OTHER): Payer: Medicare Other | Admitting: Podiatry

## 2022-03-02 ENCOUNTER — Encounter: Payer: Self-pay | Admitting: Podiatry

## 2022-03-02 DIAGNOSIS — B351 Tinea unguium: Secondary | ICD-10-CM | POA: Diagnosis not present

## 2022-03-02 DIAGNOSIS — M79676 Pain in unspecified toe(s): Secondary | ICD-10-CM

## 2022-03-02 DIAGNOSIS — E1142 Type 2 diabetes mellitus with diabetic polyneuropathy: Secondary | ICD-10-CM

## 2022-03-02 DIAGNOSIS — Q828 Other specified congenital malformations of skin: Secondary | ICD-10-CM

## 2022-03-02 DIAGNOSIS — J45909 Unspecified asthma, uncomplicated: Secondary | ICD-10-CM | POA: Insufficient documentation

## 2022-03-02 NOTE — Progress Notes (Signed)
  Subjective:  Patient ID: Richard Weeks, male    DOB: February 28, 1933,  MRN: [id number]  Richard Weeks presents to clinic today for at risk foot care with history of diabetic neuropathy and painful porokeratotic lesion(s) bilateral 3rd toes and painful mycotic toenails that limit ambulation. Painful toenails interfere with ambulation. Aggravating factors include wearing enclosed shoe gear. Pain is relieved with periodic professional debridement. Painful porokeratotic lesions are aggravated when weightbearing with and without shoegear. Pain is relieved with periodic professional debridement.   Patient states he is still wearing his neck collar because of pain after his surgery. Chief Complaint  Patient presents with   Nail Problem    DFC  BG - 105 A1C - pt does not recall  PCP - Dr Marcelline Deist . Last OV March 02 2022   New problem(s): None.   PCP is [name], MD.  Allergies  Allergen Reactions   Lisinopril Anaphylaxis    angioedema    Oxycodone    Oxycodone-Acetaminophen Other (See Comments)    delirium    Review of Systems: Negative except as noted in the HPI.  Objective: No changes noted in today's physical examination.  [name] is a pleasant 88 y.o. male in NAD. AAO x 3.  Vascular Examination: CFT <3 seconds b/l LE. Faintly palpable DP pulses b/l LE. Faintly palpable PT pulse(s) b/l LE. Pedal hair sparse. No pain with calf compression b/l. Lower extremity skin temperature gradient within normal limits. Trace edema noted BLE. No ischemia or gangrene noted b/l LE. No cyanosis or clubbing noted b/l LE.  Dermatological Examination: Pedal skin is warm and supple b/l LE.   Dressing noted left heel which is clean, dry and intact. No interdigital macerations noted b/l LE.   Toenails 1-5 bilaterally elongated, discolored, dystrophic, thickened, and crumbly with subungual debris and tenderness to dorsal palpation.   Porokeratotic lesions noted b/l 3rd toes. There is improvement  in appearance most likely due to decreased amount of walking after his neck surgery. There is no surrounding erythema, no edema, no drainage, no odor, no fluctuance.  Neurological Examination: Patient has known radiculopathy of lumbar spine. Protective sensation diminished with 10g monofilament b/l. Vibratory sensation diminished b/l. Babinski reflex negative b/l. Clonus negative b/l.  Musculoskeletal Examination: Muscle strength 5/5 to all lower extremity muscle groups bilaterally. No pain, crepitus or joint limitation noted with ROM bilateral LE. HAV with bunion deformity noted b/l LE. Hammertoe deformity noted 2-5 b/l. Pes planus deformity noted bilateral LE.  Assessment/Plan: 1. Pain due to onychomycosis of toenail   2. Porokeratosis   3. DM type 2 with diabetic peripheral neuropathy ([facility])     No orders of the defined types were placed in this encounter.   -Patient was evaluated and treated. All patient's and/or POA's questions/concerns answered on today's visit. -Continue foot and shoe inspections daily. Monitor blood glucose per PCP/Endocrinologist's recommendations. -Continue supportive shoe gear daily. -Toenails 1-5 b/l were debrided in length and girth with sterile nail nippers and dremel without iatrogenic bleeding.  -Porokeratotic lesion(s) distal tip of left 3rd toe and distal tip of right 3rd toe pared and enucleated with sterile currette without incident. Total number of lesions debrided=2. -Patient/POA to call should there be question/concern in the interim.   Return in about 3 months (around 06/02/2022).  Marzetta Board, DPM

## 2022-06-29 ENCOUNTER — Ambulatory Visit: Payer: Medicare Other | Admitting: Podiatry

## 2022-07-06 ENCOUNTER — Ambulatory Visit (INDEPENDENT_AMBULATORY_CARE_PROVIDER_SITE_OTHER): Payer: Medicare Other | Admitting: Podiatry

## 2022-07-06 ENCOUNTER — Encounter: Payer: Self-pay | Admitting: Podiatry

## 2022-07-06 DIAGNOSIS — M79676 Pain in unspecified toe(s): Secondary | ICD-10-CM

## 2022-07-06 DIAGNOSIS — E1142 Type 2 diabetes mellitus with diabetic polyneuropathy: Secondary | ICD-10-CM | POA: Diagnosis not present

## 2022-07-06 DIAGNOSIS — Q828 Other specified congenital malformations of skin: Secondary | ICD-10-CM | POA: Diagnosis not present

## 2022-07-06 DIAGNOSIS — B351 Tinea unguium: Secondary | ICD-10-CM | POA: Diagnosis not present

## 2022-07-06 NOTE — Progress Notes (Signed)
  Subjective:  Patient ID: Richard Weeks, male    DOB: 21-Jun-1932,  MRN: [id number]  Richard Weeks presents to clinic today for at risk foot care with history of diabetic neuropathy and painful porokeratotic lesion(s) bilateral 3rd toes and painful mycotic toenails that limit ambulation. Painful toenails interfere with ambulation. Aggravating factors include wearing enclosed shoe gear. Pain is relieved with periodic professional debridement. Painful porokeratotic lesions are aggravated when weightbearing with and without shoegear. Pain is relieved with periodic professional debridement.  Chief Complaint  Patient presents with   Diabetes    DIABETIC FOOT CARE, A1C- BG-118, NAIL TRIM, PCP LAST SEEN 2 WEEKS AGO    New problem(s): None.   PCP is [name], MD.  Allergies  Allergen Reactions   Lisinopril Anaphylaxis    angioedema    Oxycodone    Oxycodone-Acetaminophen Other (See Comments)    delirium   Review of Systems: Negative except as noted in the HPI.  Objective: No changes noted in today's physical examination. There were no vitals filed for this visit. Richard Weeks is a pleasant 89 y.o. male frail, in NAD. AAO x 3.  Vascular Examination: CFT <3 seconds b/l LE. Faintly palpable DP pulses b/l LE. Faintly palpable PT pulse(s) b/l LE. Pedal hair sparse. No pain with calf compression b/l. Lower extremity skin temperature gradient within normal limits. Trace edema noted BLE. No ischemia or gangrene noted b/l LE. No cyanosis or clubbing noted b/l LE.  Dermatological Examination: Pedal skin is warm and supple b/l LE.   Dressing noted left heel which is clean, dry and intact. No interdigital macerations noted b/l LE.   Toenails 1-5 bilaterally elongated, discolored, dystrophic, thickened, and crumbly with subungual debris and tenderness to dorsal palpation.   Porokeratotic lesions noted b/l 3rd toes and submet head 2 right foot. There is improvement in appearance most likely due to  decreased amount of walking after his neck surgery. There is no surrounding erythema, no edema, no drainage, no odor, no fluctuance.  Neurological Examination: Patient has known radiculopathy of lumbar spine. Protective sensation diminished with 10g monofilament b/l. Vibratory sensation diminished b/l. Babinski reflex negative b/l. Clonus negative b/l.  Musculoskeletal Examination: Muscle strength 5/5 to all lower extremity muscle groups bilaterally. No pain, crepitus or joint limitation noted with ROM bilateral LE. HAV with bunion deformity noted b/l LE. Hammertoe deformity noted 2-5 b/l. Pes planus deformity noted bilateral LE.  Assessment/Plan: 1. Pain due to onychomycosis of toenail   2. Porokeratosis   3. DM type 2 with diabetic peripheral neuropathy ([place])   -Consent given for treatment as described below: -Examined patient. -Patient to continue soft, supportive shoe gear daily. -Mycotic toenails 1-5 bilaterally were debrided in length and girth with sterile nail nippers and dremel without incident. -Porokeratotic lesion(s) bilateral 3rd toes and submet head 2 right foot pared and enucleated with sterile currette without incident. Total number of lesions debrided=3. -Patient/POA to call should there be question/concern in the interim.   Return in about 3 months (around 10/06/2022).  Marzetta Board, DPM

## 2022-10-11 ENCOUNTER — Encounter: Payer: Medicare Other | Admitting: Podiatry

## 2022-10-13 NOTE — Progress Notes (Signed)
This encounter was created in error - please disregard.  Patient was a no-show for scheduled appointment today.

## 2023-01-03 ENCOUNTER — Ambulatory Visit (INDEPENDENT_AMBULATORY_CARE_PROVIDER_SITE_OTHER): Payer: Medicare Other | Admitting: Podiatry

## 2023-01-03 DIAGNOSIS — E1142 Type 2 diabetes mellitus with diabetic polyneuropathy: Secondary | ICD-10-CM | POA: Diagnosis not present

## 2023-01-03 DIAGNOSIS — L84 Corns and callosities: Secondary | ICD-10-CM | POA: Diagnosis not present

## 2023-01-03 DIAGNOSIS — I739 Peripheral vascular disease, unspecified: Secondary | ICD-10-CM

## 2023-01-03 DIAGNOSIS — B351 Tinea unguium: Secondary | ICD-10-CM | POA: Diagnosis not present

## 2023-01-03 DIAGNOSIS — M79676 Pain in unspecified toe(s): Secondary | ICD-10-CM

## 2023-01-03 DIAGNOSIS — Z0189 Encounter for other specified special examinations: Secondary | ICD-10-CM

## 2023-01-03 DIAGNOSIS — E119 Type 2 diabetes mellitus without complications: Secondary | ICD-10-CM

## 2023-01-03 NOTE — Progress Notes (Signed)
Subjective:  Patient ID: Richard Weeks, male    DOB: Aug 29, 1932,  MRN: [id number]  Richard Weeks presents to clinic today for:  Chief Complaint  Patient presents with   Nail Problem    Diabetic Foot Care-nail trim    Callouses    Callus trim to right foot   . Patient notes nails are thick and elongated, causing pain in shoe gear when ambulating.  He gets a painful callus right submet 2.  Denies any opening or drainage from this area.  He had a diabetic shoe consult earlier this spring, but never heard back and apparently his shoes were never ordered.  Due to the timeframe that the paperwork needs to be completed by the primary care physician, we will need to do a new order/shoe consult ordered today.  PCP is Johny Sax, MD. last seen 12/07/2022.  HbA1c level 1 year ago was 5.4  Past Medical History:  Diagnosis Date   Bruises easily    Diabetes mellitus    Emphysema    Fatigue    High blood pressure    History of bladder problems    Memory loss    Muscle cramps    pain   Swelling of lower limb    Legs and feet    Allergies  Allergen Reactions   Lisinopril Anaphylaxis    angioedema    Oxycodone    Oxycodone-Acetaminophen Other (See Comments)    delirium    Objective:  Jove Hanninen is a pleasant 89 y.o. male in NAD. AAO x 3.  Vascular Examination: Patient has palpable DP pulse, absent PT pulse bilateral.  Delayed capillary refill bilateral toes.  Sparse digital hair bilateral.  Proximal to distal cooling WNL bilateral.  Mild edema to the lower extremity bilateral  Dermatological Examination: Interspaces are clear with no open lesions noted bilateral.  Skin is shiny and atrophic bilateral.  Nails are 3-35mm thick, with yellowish/brown discoloration, subungual debris and distal onycholysis x10.  There is pain with compression of nails x10.  There are hyperkeratotic lesions noted right submet 2.  Neurological Examination: Protective sensation intact b/l LE. Vibratory  sensation diminished b/l LE.  Patient qualifies for at-risk foot care because of diabetes with PVD.  Assessment/Plan: 1. Pain due to onychomycosis of toenail   2. DM type 2 with diabetic peripheral neuropathy (HCC)   3. Pre-ulcerative corn or callous   4. PVD (peripheral vascular disease) (HCC)   5. Encounter for diabetic foot exam (HCC)     Mycotic nails x10 were sharply debrided with sterile nail nippers and power debriding burr to decrease bulk and length.  Hyperkeratotic lesion x 1 was shaved with #312 blade.  The new order was placed today for a diabetic shoe consult with our pedorthist.  He will need 1 pair of diabetic shoes with 3 pairs of custom diabetic insoles with the second met head offloaded on the right foot to accommodate the high pressure area.  Apologized to the patient for any miscommunication or misplaced paperwork due to high staff turnaround this year.  Return in about 3 months (around 04/04/2023) for RFC.   Clerance Lav, DPM, FACFAS [facility]     [address], [place] [address]  Office [phone]  Fax [phone]

## 2023-01-16 ENCOUNTER — Ambulatory Visit: Payer: Medicare Other

## 2023-01-16 DIAGNOSIS — E1142 Type 2 diabetes mellitus with diabetic polyneuropathy: Secondary | ICD-10-CM

## 2023-01-16 DIAGNOSIS — M2041 Other hammer toe(s) (acquired), right foot: Secondary | ICD-10-CM

## 2023-01-16 DIAGNOSIS — L84 Corns and callosities: Secondary | ICD-10-CM

## 2023-01-16 DIAGNOSIS — I739 Peripheral vascular disease, unspecified: Secondary | ICD-10-CM

## 2023-01-16 NOTE — Progress Notes (Signed)
Patient presents to the office today for diabetic shoe and insole measuring.  Patient was measured with brannock device to determine size and width for 1 pair of extra depth shoes and foam casted for 3 pair of insoles.   Documentation of medical necessity will be sent to patient's treating diabetic doctor to verify and sign.   Patient's diabetic provider: Farrel Gobble MD   Shoes and insoles will be ordered at that time and patient will be notified for an appointment for fitting when they arrive.   Shoe size (per patient): 12 Brannock measurement: Patient shoe selection-  Shoe choice:   584 discontinued ordered 510 by ortho next if not avail A6000M apex  Shoe size ordered: ABN an financials signed  Need to scan and send to ST when back in [place] [employer], CFo, CFm

## 2023-04-04 ENCOUNTER — Ambulatory Visit: Payer: Medicare Other | Admitting: Podiatry

## 2023-04-04 DIAGNOSIS — M79675 Pain in left toe(s): Secondary | ICD-10-CM

## 2023-04-04 DIAGNOSIS — M79674 Pain in right toe(s): Secondary | ICD-10-CM

## 2023-04-04 DIAGNOSIS — L84 Corns and callosities: Secondary | ICD-10-CM

## 2023-04-04 DIAGNOSIS — B351 Tinea unguium: Secondary | ICD-10-CM | POA: Diagnosis not present

## 2023-04-04 DIAGNOSIS — E1151 Type 2 diabetes mellitus with diabetic peripheral angiopathy without gangrene: Secondary | ICD-10-CM

## 2023-04-04 NOTE — Progress Notes (Unsigned)
       Subjective:  Patient ID: Richard Weeks, male    DOB: 1933/03/14,  MRN: [id number]  Richard Weeks presents to clinic today for:  Chief Complaint  Patient presents with   Diabetes    [facility]   Callouses    RIGHT BALL OF FOOT   Patient notes nails are thick and elongated, causing pain in shoe gear when ambulating.  Painful callus right submet 3 also  PCP is Johny Sax, MD.  Last seen around 03/27/23  Past Medical History:  Diagnosis Date   Bruises easily    Diabetes mellitus    Emphysema    Fatigue    High blood pressure    History of bladder problems    Memory loss    Muscle cramps    pain   Swelling of lower limb    Legs and feet    Allergies  Allergen Reactions   Lisinopril Anaphylaxis    angioedema    Oxycodone    Oxycodone-Acetaminophen Other (See Comments)    delirium   Objective:  Richard Weeks is a pleasant 89 y.o. male in NAD. AAO x 3.  Vascular Examination: Patient has palpable DP pulse, absent PT pulse bilateral.  Delayed capillary refill bilateral toes.  Sparse digital hair bilateral.  Proximal to distal cooling WNL bilateral.    Dermatological Examination: Interspaces are clear with no open lesions noted bilateral.  Skin is shiny and atrophic bilateral.  Nails are 3-50mm thick, with yellowish/brown discoloration, subungual debris and distal onycholysis x10.  There is pain with compression of nails x10.  There are hyperkeratotic lesions noted right submet 3 .  Patient qualifies for at-risk foot care because of diabetes with PVD .  Assessment/Plan: 1. Pain due to onychomycosis of toenails of both feet   2. Pre-ulcerative corn or callous   3. Type II diabetes mellitus with peripheral circulatory disorder (HCC)    Mycotic nails x10 were sharply debrided with sterile nail nippers and power debriding burr to decrease bulk and length.  Hyperkeratotic lesion x1 was shaved with #312 blade.   Return in about 3 months (around 07/03/2023) for [facility].   Clerance Lav, DPM, FACFAS [facility]     [address] [place], [place] [address]                Office [phone]  Fax [phone]

## 2023-05-01 ENCOUNTER — Telehealth: Payer: Self-pay

## 2023-05-01 NOTE — Telephone Encounter (Signed)
 New order placed for shoes corrected treating Dr and faxed ppw, Dr Isa Rankin CPed, CFo, CFm

## 2023-05-09 ENCOUNTER — Telehealth: Payer: Self-pay

## 2023-05-09 NOTE — Telephone Encounter (Signed)
 Dr signed ppwk for dm shoes.

## 2023-05-31 ENCOUNTER — Telehealth: Payer: Self-pay

## 2023-05-31 NOTE — Telephone Encounter (Signed)
 Left vm to schedule diabetic shoe pick up

## 2023-06-07 ENCOUNTER — Telehealth: Payer: Self-pay

## 2023-06-07 NOTE — Telephone Encounter (Signed)
Left 2nd voicemail for diabetic shoe pick up

## 2023-07-03 ENCOUNTER — Telehealth: Payer: Self-pay

## 2023-07-03 NOTE — Telephone Encounter (Signed)
 Dm shoes are in [place] ppwk expires 4/10

## 2023-07-05 ENCOUNTER — Ambulatory Visit (INDEPENDENT_AMBULATORY_CARE_PROVIDER_SITE_OTHER): Payer: Medicare Other | Admitting: Podiatry

## 2023-07-05 DIAGNOSIS — L84 Corns and callosities: Secondary | ICD-10-CM

## 2023-07-05 DIAGNOSIS — B351 Tinea unguium: Secondary | ICD-10-CM | POA: Diagnosis not present

## 2023-07-05 DIAGNOSIS — M79675 Pain in left toe(s): Secondary | ICD-10-CM

## 2023-07-05 DIAGNOSIS — M2042 Other hammer toe(s) (acquired), left foot: Secondary | ICD-10-CM | POA: Diagnosis not present

## 2023-07-05 DIAGNOSIS — M2041 Other hammer toe(s) (acquired), right foot: Secondary | ICD-10-CM

## 2023-07-05 DIAGNOSIS — E1151 Type 2 diabetes mellitus with diabetic peripheral angiopathy without gangrene: Secondary | ICD-10-CM | POA: Diagnosis not present

## 2023-07-05 DIAGNOSIS — M79674 Pain in right toe(s): Secondary | ICD-10-CM | POA: Diagnosis not present

## 2023-07-05 NOTE — Progress Notes (Signed)
 Subjective:  Patient ID: Richard Weeks, male    DOB: Mar 01, 1933,  MRN: [id number]  Richard Weeks presents to clinic today for:  Chief Complaint  Patient presents with   [facility]    [facility] today. And shoe pick up    Patient notes nails are thick and elongated, causing pain in shoe gear when ambulating.  He also has painful calluses bilateral.  His diabetic shoes are in for fitting and dispensing today.  Patient also requested that we wash his feet today and rub his legs.  PCP is Johny Sax, MD. last seen around 05/16/23  Past Medical History:  Diagnosis Date   Bruises easily    Diabetes mellitus    Emphysema    Fatigue    High blood pressure    History of bladder problems    Memory loss    Muscle cramps    pain   Swelling of lower limb    Legs and feet    Allergies  Allergen Reactions   Lisinopril Anaphylaxis    angioedema    Oxycodone    Oxycodone-Acetaminophen Other (See Comments)    delirium    Objective:  Richard Weeks is a pleasant 89 y.o. male in NAD. AAO x 3.  Vascular Examination: Patient has palpable DP pulse, absent PT pulse bilateral.  Delayed capillary refill bilateral toes.  Sparse digital hair bilateral.  Proximal to distal cooling WNL bilateral.    Dermatological Examination: Interspaces are clear with no open lesions noted bilateral.  Skin is shiny and atrophic bilateral.  Nails are 3-15mm thick, with yellowish/brown discoloration, subungual debris and distal onycholysis x10.  There is pain with compression of nails x10.  There are hyperkeratotic lesions noted distal left third toe, right submet 2, and left medial heel.  He patient qualifies for at-risk foot care because of diabetes with PVD  Assessment/Plan: 1. Pain due to onychomycosis of toenails of both feet   2. Type II diabetes mellitus with peripheral circulatory disorder (HCC)   3. Pre-ulcerative corn or callous   4. Acquired hammertoes of both feet    Mycotic nails x10 were sharply debrided  with sterile nail nippers and power debriding burr to decrease bulk and length.  Hyperkeratotic lesions x 3 were shaved with #312 blade.  Two of the lesions are proximal to the toes.  Patient was fitted for the diabetic shoes today.  Patient felt that they were snug across the forefoot near metatarsals 1 through 5.  The bones were not prominent or protruding but his with could easily be palpated at the edge of the shoes.  Recommended exchange for a wider fit, but the patient is worried about how long he will have to wait this time for the shoes.  It is noted in the chart that several calls had been made to the patient and not returned to schedule him for shoe fitting.  Patient was adamant that he wants to take the shoes with him to see if that will "loosen up" over the next 2 weeks.  Reminded patient of the shoe return policy.  He must return to shoes and a nonsoiled condition and can only wear them around the house while breaking them in.  They will need to be returned within the next 2 to 3 weeks if the foot is not correct.  The length appears to be sufficient but he may need a wider width in the forefoot.  The patient signed the proof of delivery forms and  was given a copy of the supplier standards today.  He was also given a copy of the proof of delivery form with the written break-in instructions.  Informed the patient that this is not a spa service for podiatric care.  If he wants his feet and legs washed and rubbed/massage, he will need to schedule an appointment at a day spa.  This is the second time he has requested this type of service here  Return in about 3 months (around 10/05/2023) for [facility].   Clerance Lav, DPM, FACFAS [facility]     [address] [place], [place] [address]                Office [phone]  Fax [phone]

## 2023-10-11 ENCOUNTER — Ambulatory Visit (INDEPENDENT_AMBULATORY_CARE_PROVIDER_SITE_OTHER): Admitting: Podiatry

## 2023-10-11 DIAGNOSIS — M79675 Pain in left toe(s): Secondary | ICD-10-CM | POA: Diagnosis not present

## 2023-10-11 DIAGNOSIS — B351 Tinea unguium: Secondary | ICD-10-CM

## 2023-10-11 DIAGNOSIS — M79674 Pain in right toe(s): Secondary | ICD-10-CM

## 2023-10-11 DIAGNOSIS — L84 Corns and callosities: Secondary | ICD-10-CM

## 2023-10-11 DIAGNOSIS — E1151 Type 2 diabetes mellitus with diabetic peripheral angiopathy without gangrene: Secondary | ICD-10-CM | POA: Diagnosis not present

## 2023-10-11 NOTE — Progress Notes (Unsigned)
 Nails x 10.  Patient was being picky and wanting nails cut to his specifications.  There is right submet 2 corn and distal third toe corn bilateral.  These were shaved.  He also wanted his heels sanded.  Patient needs to realize this is not a Psychologist, forensic.  Asked the medical assistant to take care of this but he needs to moisturize which she is not doing.

## 2024-01-10 ENCOUNTER — Ambulatory Visit: Admitting: Podiatry

## 2024-01-10 DIAGNOSIS — L84 Corns and callosities: Secondary | ICD-10-CM

## 2024-01-10 DIAGNOSIS — M79675 Pain in left toe(s): Secondary | ICD-10-CM | POA: Diagnosis not present

## 2024-01-10 DIAGNOSIS — E1151 Type 2 diabetes mellitus with diabetic peripheral angiopathy without gangrene: Secondary | ICD-10-CM | POA: Diagnosis not present

## 2024-01-10 DIAGNOSIS — B351 Tinea unguium: Secondary | ICD-10-CM

## 2024-01-10 DIAGNOSIS — M79674 Pain in right toe(s): Secondary | ICD-10-CM

## 2024-01-10 NOTE — Progress Notes (Signed)
    Subjective:  Patient ID: Richard Weeks, male    DOB: 09/24/1932,  MRN: [id number]  Richard Weeks presents to clinic today for:  Chief Complaint  Patient presents with   [facility]    [facility] with callous A1c 5.0 in June ASA   Patient notes nails are thick and elongated, causing pain in shoe gear when ambulating.  He has painful calluses bilateral.  He is very particular, almost demanding, about his footcare to be performed.  PCP is Meliton Barter, MD.  Last seen around 11/16/23  Past Medical History:  Diagnosis Date   Bruises easily    Diabetes mellitus    Emphysema    Fatigue    High blood pressure    History of bladder problems    Memory loss    Muscle cramps    pain   Swelling of lower limb    Legs and feet   Allergies  Allergen Reactions   Lisinopril Anaphylaxis    angioedema    Oxycodone    Oxycodone-Acetaminophen Other (See Comments)    delirium    Objective:  Richard Weeks is a pleasant 89 y.o. male in NAD. AAO x 3.  Vascular Examination: Patient has palpable DP pulse, absent PT pulse bilateral.  Delayed capillary refill bilateral toes.  Sparse digital hair bilateral.  Proximal to distal cooling WNL bilateral.    Dermatological Examination: Interspaces are clear with no open lesions noted bilateral.  Skin is shiny and atrophic bilateral.  Nails are 3-73mm thick, with yellowish/brown discoloration, subungual debris and distal onycholysis x10.  There is pain with compression of nails x10.  There are hyperkeratotic lesions noted along periphery of both heels .  Patient qualifies for at-risk foot care because of diabetes with PVD .  Assessment/Plan: 1. Pain due to onychomycosis of toenails of both feet   2. Pre-ulcerative corn or callous   3. Type II diabetes mellitus with peripheral circulatory disorder (HCC)     Mycotic nails x10 were sharply debrided with sterile nail nippers and power debriding burr to decrease bulk and length.  Hyperkeratotic lesions on bilateral  heels were shaved with #312 blade.  Return in about 3 months (around 04/10/2024) for [facility].   Awanda CHARM Imperial, DPM, FACFAS [facility]     [address] [place], [place] [address]                Office [phone]  Fax [phone]

## 2024-01-31 ENCOUNTER — Ambulatory Visit

## 2024-01-31 NOTE — Progress Notes (Signed)
 Patient was c/o of foot feeling crowded in shoe  I thinned out inserts to give more room inside shoe patient says its still uncomfortable I removed DM inserts and added just yellow spacers and he states this feels better then said shoes are too heavy  I explained he received these 7 months ago are dirty and I cannot return these  He said he will wait until elig again in March  Lolita Schultze

## 2024-04-08 ENCOUNTER — Telehealth: Payer: Self-pay

## 2024-04-08 ENCOUNTER — Ambulatory Visit: Payer: Self-pay | Admitting: Podiatry

## 2024-04-08 NOTE — Telephone Encounter (Signed)
 There was a message left on the triage line that patient was going to the ER and wasn't able to make his appointment for today. Message came in at 1:10 pm 04/08/24

## 2024-04-10 NOTE — Progress Notes (Signed)
Patient did not show for scheduled appointment today.
